# Patient Record
Sex: Female | Born: 1955 | Race: White | Hispanic: No | Marital: Married | Smoking: Never smoker
Health system: Southern US, Community
[De-identification: ages and names within clinical notes are randomized; demographics above are authoritative.]

## PROBLEM LIST (undated history)

## (undated) DIAGNOSIS — C801 Malignant (primary) neoplasm, unspecified: Secondary | ICD-10-CM

## (undated) HISTORY — PX: CHOLECYSTECTOMY: SHX55

## (undated) HISTORY — PX: EXPLORATORY LAPAROTOMY: SUR591

## (undated) HISTORY — PX: BREAST LUMPECTOMY: SHX2

## (undated) HISTORY — PX: ENDOMETRIAL ABLATION: SHX621

## (undated) HISTORY — PX: ABDOMINAL HYSTERECTOMY: SHX81

## (undated) HISTORY — PX: DERMOID CYST  EXCISION: SHX1452

## (undated) HISTORY — PX: BREAST RECONSTRUCTION: SHX9

---

## 1999-03-23 ENCOUNTER — Other Ambulatory Visit: Admission: RE | Admit: 1999-03-23 | Discharge: 1999-03-23 | Payer: Self-pay | Admitting: Obstetrics and Gynecology

## 2001-01-25 ENCOUNTER — Other Ambulatory Visit: Admission: RE | Admit: 2001-01-25 | Discharge: 2001-01-25 | Payer: Self-pay | Admitting: *Deleted

## 2001-03-29 ENCOUNTER — Ambulatory Visit (HOSPITAL_COMMUNITY): Admission: RE | Admit: 2001-03-29 | Discharge: 2001-03-29 | Payer: Self-pay | Admitting: *Deleted

## 2002-08-06 ENCOUNTER — Other Ambulatory Visit: Admission: RE | Admit: 2002-08-06 | Discharge: 2002-08-06 | Payer: Self-pay | Admitting: Obstetrics and Gynecology

## 2005-04-26 ENCOUNTER — Other Ambulatory Visit: Admission: RE | Admit: 2005-04-26 | Discharge: 2005-04-26 | Payer: Self-pay | Admitting: Obstetrics and Gynecology

## 2007-07-01 LAB — HM PAP SMEAR: HM Pap smear: NORMAL

## 2007-07-31 ENCOUNTER — Ambulatory Visit: Payer: Self-pay | Admitting: Family Medicine

## 2008-06-02 LAB — LIPID PANEL
Cholesterol: 247 mg/dL — AB (ref 0–200)
HDL: 46 mg/dL (ref 35–70)
LDL Cholesterol: 160 mg/dL
LDl/HDL Ratio: 3.5
Triglycerides: 203 mg/dL — AB (ref 40–160)

## 2008-06-02 LAB — TSH: TSH: 1.54 u[IU]/mL (ref <=5.90)

## 2008-06-15 ENCOUNTER — Ambulatory Visit: Payer: Self-pay | Admitting: Family Medicine

## 2009-01-13 ENCOUNTER — Ambulatory Visit: Payer: Self-pay | Admitting: Family Medicine

## 2009-02-02 ENCOUNTER — Ambulatory Visit: Payer: Self-pay | Admitting: Surgery

## 2011-12-20 DIAGNOSIS — C7951 Secondary malignant neoplasm of bone: Secondary | ICD-10-CM | POA: Insufficient documentation

## 2012-02-06 DIAGNOSIS — C50919 Malignant neoplasm of unspecified site of unspecified female breast: Secondary | ICD-10-CM | POA: Insufficient documentation

## 2012-07-24 DIAGNOSIS — M4850XA Collapsed vertebra, not elsewhere classified, site unspecified, initial encounter for fracture: Secondary | ICD-10-CM | POA: Insufficient documentation

## 2012-07-24 DIAGNOSIS — G588 Other specified mononeuropathies: Secondary | ICD-10-CM | POA: Insufficient documentation

## 2012-07-24 DIAGNOSIS — G894 Chronic pain syndrome: Secondary | ICD-10-CM | POA: Insufficient documentation

## 2012-07-24 DIAGNOSIS — F1120 Opioid dependence, uncomplicated: Secondary | ICD-10-CM | POA: Insufficient documentation

## 2012-10-09 ENCOUNTER — Ambulatory Visit: Payer: Self-pay | Admitting: Family Medicine

## 2012-11-27 ENCOUNTER — Ambulatory Visit: Payer: Self-pay | Admitting: Family Medicine

## 2013-05-01 ENCOUNTER — Ambulatory Visit: Payer: Self-pay | Admitting: Oncology

## 2013-05-05 ENCOUNTER — Ambulatory Visit: Payer: Self-pay | Admitting: Oncology

## 2013-05-08 ENCOUNTER — Ambulatory Visit: Payer: Self-pay | Admitting: Oncology

## 2013-05-10 ENCOUNTER — Ambulatory Visit: Payer: Self-pay | Admitting: Oncology

## 2013-05-20 DIAGNOSIS — C50919 Malignant neoplasm of unspecified site of unspecified female breast: Secondary | ICD-10-CM | POA: Insufficient documentation

## 2013-06-09 ENCOUNTER — Ambulatory Visit: Payer: Self-pay | Admitting: Oncology

## 2013-08-15 LAB — HEPATIC FUNCTION PANEL
ALT: 26 U/L (ref 7–35)
AST: 47 U/L — AB (ref 13–35)
Alkaline Phosphatase: 128 U/L — AB (ref 25–125)
Bilirubin, Total: 0.2 mg/dL

## 2013-08-15 LAB — BASIC METABOLIC PANEL
BUN: 8 mg/dL (ref 4–21)
Creatinine: 0.6 mg/dL (ref <=1.1)
Glucose: 88 mg/dL
Potassium: 4.6 mmol/L (ref 3.4–5.3)
Sodium: 144 mmol/L (ref 137–147)

## 2013-08-20 LAB — CBC AND DIFFERENTIAL
HCT: 37 % (ref 36–46)
Hemoglobin: 12.3 g/dL (ref 12.0–16.0)
Neutrophils Absolute: 2 /uL
Platelets: 222 10*3/uL (ref 150–399)
WBC: 3.1 10^3/mL

## 2013-08-25 ENCOUNTER — Ambulatory Visit: Payer: Self-pay | Admitting: Family Medicine

## 2014-02-04 ENCOUNTER — Ambulatory Visit: Payer: Self-pay

## 2014-08-03 ENCOUNTER — Ambulatory Visit: Payer: Self-pay | Admitting: Hematology and Oncology

## 2014-10-30 NOTE — Consult Note (Signed)
Note Type Consult   HPI: Referred by Primary physician Dr. Rosanna Randy.   This 59 year old Female patient presents to the clinic for second opinion about treatment plan for  Stage IV carcinoma of breast.  Subjective: Chief Complaint/Diagnosis:   1.carcinoma of breast diagnosed in July of 2010 (right breast) ER positive PR negative HER-2 negative to  2+ by IHC not amplified by FISH. Patient underwent neoadjuvant Aromasin and treatment Oncotype DX was 38 patient received adjuvant chemotherapy from October 2 010 5th Cytoxan, Adriamycin, 5-FU for 3 cycles followed by Taxotere followed by Abraxane (patient finished neoadjuvant chemotherapy on January of 2011) As mentioned above right breast lumpectomy.  Followed by modified radical mastectomy. yp T3 yp  not examined 1 mic. MO March of 2011 Had radiation therapy to the chest wall(June, 2011)was started on remained ask and then switched to Femara.  Was discontinued on May of 2013 because of metastatic disease.  The bone. Patient had a pain in the right arm and CT scan sows bone metastatic disease needle biopsy was consistent with metastatic disease.  Patient was then tried Aromasin, feeding ago, palliative radiation therapy to the spine.in July of 2014   switched to   progressing tumor marker and progressing disease.tumor markers on October 2 was 437.4 (CA  15-3)in July   2014  was 240.7MRI scan of spine on October 11 sows multiple new lesions. HPI:   59 year old lady with history of carcinoma of breastwith metastatic disease to bone here for further evaluation and treatment consideration.  Patient is getting most of his care at [facility].condition has been declining having increasing pain.  Rising tumor markers.  Recent MRI scan shows multiple new lesions.is under better control after increasing fentanyl patch to 37 mcg.  With the breakthrough pain medication.has been poor.presently getting Fasladex . Patient desired another opinion number does not  want to establish care locally   Review of Systems:  General: weakness  fatigue  Performance Status (ECOG): 1  HEENT: no complaints  Lungs: no complaints  Cardiac: no complaints  GI: no complaints  GU: no complaints  Musculoskeletal: back pain  muscle ache  Extremities: no complaints  Skin: no complaints  Neuro: no complaints  Endocrine: no complaints  Pain ?: Yes  Pain- Qualitative: Moderate  Pain- Plan: Narcotic analgesic ordered  Pain Effectiveness: partial pain relief  Review of Systems: All other systems were reviewed and found to be negative   Allergies:  Docetaxel: Hives  Prochlorperazine: Unknown  Significant History/PMH:   intercostal neuralgia:    vertebral compression fracture:    goiter:    IBS:    anxiety:    Arthritis:    Psoriasis:    GERD:    Right breast cancer with reconstruction:    bone mets:   Smoking History: Smoking History Never Smoked.  PFSH: Family History: noncontributory  Social History: negative alcohol, negative tobacco  Additional Past Medical and Surgical History: As mentioned above   Home Medications: Medication Instructions Last Modified Date/Time  fentaNYL 25 mcg/hr transdermal film, extended release 1 patch transdermal every 72 hours 27-Oct-14 14:16  fentaNYL 12 mcg/hr transdermal film, extended release 1 patch transdermal every 72 hours 27-Oct-14 14:16  Vitamin B12 1000 mcg oral tablet 1 tab(s) orally once a day 27-Oct-14 14:16  oxaprozin 600 mg oral tablet 1 tab(s) orally 2 times a day 27-Oct-14 14:16  gabapentin 100 mg oral capsule 3 cap(s) orally once a day in the morning, 7 caps orally at bedtime 27-Oct-14 14:16  Lidoderm 5% topical film Apply topically to affected area once a day, As Needed 27-Oct-14 14:16  LORazepam 1 mg oral tablet 1 tab(s) orally 2 times a day, 1/2 tab in the morning and 1 tab at bedtime 27-Oct-14 14:16  SUMAtriptan 100 mg oral tablet 1 tab(s) orally once, As Needed 27-Oct-14 14:16   chondroitin-glucosamine 400 mg-500 mg oral capsule 1 tab(s) orally once a day 27-Oct-14 14:16  calcium phosphate-vitamin D3 200mg  calcium-200 unit Chew 1 tab(s) orally once a day 27-Oct-14 14:16  pyridoxine 100 mg oral tablet 1 tab(s) orally once a day 27-Oct-14 14:16  ZOLMitriptan 2.5 mg oral tablet 1 tab(s) orally once, As Needed for headache, if headache returns, may take second dose after 2 hours 27-Oct-14 14:16  omeprazole 20 mg oral delayed release capsule 1 cap(s) orally 2 times a day 27-Oct-14 14:16  citalopram 20 mg oral tablet 1 tab(s) orally once a day 27-Oct-14 14:16  docusate sodium 100 mg oral capsule 3 cap(s) orally 2 times a day 27-Oct-14 14:16   Vital Signs:  :: Ht(CM): 160 Wt(KG): 62.2 BSA: 1.6 Temp: 96.3 Pulse: 65 RR: 18  BP: 134/53   Physical Exam:  General: Patient is alert oriented not in any acute distress  Head, Ears, Nose,Throat: normal, no lesions or deformities  Neck, Thyroid: no thyroid tenderness, enlargement or nodule.  neck supple without massess or tenderness. no adenopathy.  no carotid bruit.  Respiratory: Examination of chest: Air entry equal on both sides.  Emphysematous chest   Rhonchi: Not present   No wheezing    no rales  Cardiovascular: No murmur.  Heart sounds are within normal limit   No evidence of pericardial rub  Breast: Right breast patient had a reconstructive surgery.  LEFT  breast field masses  Gastrointestinal: Abdomen: Soft   Liver not palpable   Spleen not palpable   No tenderness   No ascites   Bowel sounds are present  Musculoskeletal: No deformity..   No evidence of fracture..   No joint swelling..   Lower extremity no edema  Skin: no rashes, ulcers, or lesions  Neurological: Higher functions have been normal limit.      Cranial nerves are intact   Motor system no evidence of localizing weakness   Sensory system no evidence of localizing weakness   No evidence of peripheral neuropathy  Lymphatics: no cervical, axillary, or  inguinal lymphadenopathy   Lab Results Review:  Lab Results   all tumor markers from [facility] has been reviewed  Assessment and Plan: Impression:   1.carcinoma of breast metastases to the bone on clinical grounds based on MRI scan and tumor markers patient is progressing on present treatment with FASLADEX .is on XGEVA All the records from the [facility] has been reviewed. all records patient either did not tolerate or has failed multiple hormone manipulation.went over all the information with patientwould recommend PET scan to be sure there is no organ involvement with metastatic diseasealso discussed possibility of chemotherapy options rather than going to any phase I triallike eribulin Abraxane has proven valuealso discussed foundation one approach and let cough clinical validation with such approaches. am not sure actually what patient's desire is at present timeappears that patient's pain is under better control after increasing fentanyl patch. patient wants and then after PET scan for restaging we can meet again and discussed  availability of our clinical trialsthen patient can decide whether to continue her treatment program  CC Referral:  cc: Dr Rosanna Randy   Electronic  Signatures: Jobe Gibbon (MD)  (Signed 28-Oct-14 13:10)  Authored: Note Type, History of Present Illness, CC/HPI, Review of Systems, ALLERGIES, PAST MEDICAL HISTORY, Smoking Cessation, Patient Family Social History, HOME MEDICATIONS, Vital Signs, Physical Exam, Lab Results Review, Assessment and Plan, CC Referring Physician   Last Updated: 28-Oct-14 13:10 by Jobe Gibbon (MD)

## 2015-01-25 DIAGNOSIS — Z9221 Personal history of antineoplastic chemotherapy: Secondary | ICD-10-CM | POA: Insufficient documentation

## 2015-01-25 DIAGNOSIS — G4700 Insomnia, unspecified: Secondary | ICD-10-CM | POA: Insufficient documentation

## 2015-01-25 DIAGNOSIS — G8929 Other chronic pain: Secondary | ICD-10-CM | POA: Insufficient documentation

## 2015-01-25 DIAGNOSIS — M549 Dorsalgia, unspecified: Secondary | ICD-10-CM

## 2015-01-25 DIAGNOSIS — F32A Depression, unspecified: Secondary | ICD-10-CM | POA: Insufficient documentation

## 2015-01-25 DIAGNOSIS — C50919 Malignant neoplasm of unspecified site of unspecified female breast: Secondary | ICD-10-CM | POA: Insufficient documentation

## 2015-01-25 DIAGNOSIS — B009 Herpesviral infection, unspecified: Secondary | ICD-10-CM | POA: Insufficient documentation

## 2015-01-25 DIAGNOSIS — D72819 Decreased white blood cell count, unspecified: Secondary | ICD-10-CM | POA: Insufficient documentation

## 2015-01-25 DIAGNOSIS — G43909 Migraine, unspecified, not intractable, without status migrainosus: Secondary | ICD-10-CM | POA: Insufficient documentation

## 2015-01-25 DIAGNOSIS — K589 Irritable bowel syndrome without diarrhea: Secondary | ICD-10-CM | POA: Insufficient documentation

## 2015-01-25 DIAGNOSIS — L409 Psoriasis, unspecified: Secondary | ICD-10-CM | POA: Insufficient documentation

## 2015-01-25 DIAGNOSIS — O99810 Abnormal glucose complicating pregnancy: Secondary | ICD-10-CM | POA: Insufficient documentation

## 2015-01-25 DIAGNOSIS — M129 Arthropathy, unspecified: Secondary | ICD-10-CM | POA: Insufficient documentation

## 2015-01-25 DIAGNOSIS — IMO0002 Reserved for concepts with insufficient information to code with codable children: Secondary | ICD-10-CM | POA: Insufficient documentation

## 2015-01-25 DIAGNOSIS — K219 Gastro-esophageal reflux disease without esophagitis: Secondary | ICD-10-CM | POA: Insufficient documentation

## 2015-01-25 DIAGNOSIS — F411 Generalized anxiety disorder: Secondary | ICD-10-CM | POA: Insufficient documentation

## 2015-01-25 DIAGNOSIS — F329 Major depressive disorder, single episode, unspecified: Secondary | ICD-10-CM | POA: Insufficient documentation

## 2015-01-25 DIAGNOSIS — E041 Nontoxic single thyroid nodule: Secondary | ICD-10-CM | POA: Insufficient documentation

## 2015-01-26 ENCOUNTER — Ambulatory Visit: Payer: Self-pay | Admitting: Family Medicine

## 2015-01-28 ENCOUNTER — Encounter: Payer: Self-pay | Admitting: Family Medicine

## 2015-01-28 ENCOUNTER — Ambulatory Visit (INDEPENDENT_AMBULATORY_CARE_PROVIDER_SITE_OTHER): Payer: 59 | Admitting: Family Medicine

## 2015-01-28 VITALS — BP 108/60 | HR 88 | Temp 97.9°F | Resp 16 | Wt 135.0 lb

## 2015-01-28 DIAGNOSIS — F411 Generalized anxiety disorder: Secondary | ICD-10-CM

## 2015-01-28 DIAGNOSIS — K219 Gastro-esophageal reflux disease without esophagitis: Secondary | ICD-10-CM | POA: Insufficient documentation

## 2015-01-28 DIAGNOSIS — L4050 Arthropathic psoriasis, unspecified: Secondary | ICD-10-CM | POA: Insufficient documentation

## 2015-01-28 MED ORDER — LORAZEPAM 0.5 MG PO TABS: 0.5000 mg | ORAL_TABLET | Freq: Three times a day (TID) | ORAL | Status: DC

## 2015-01-28 NOTE — Progress Notes (Signed)
Patient ID: Cristina David, female   DOB: Aug 15, 1955, 59 y.o.   MRN: [id number]    Subjective:  HPI Pt reports that she is here to get her Lorazepam rx reduced. She would like to get the 0.5 mg instead of the 1mg . She is doing better and is on so much medication that makes her sleepy, she does not need as much of this to make her more sleepy. She is requesting the 0.5mg  and possibly breaking those in half when she needs to.   Prior to Admission medications   Medication Sig Start Date End Date Taking? Authorizing Provider  Capecitabine (XELODA PO) Take 3,000 mg by mouth daily.   Yes Historical Provider, MD  citalopram (CELEXA) 20 MG tablet Take by mouth. 03/26/14  Yes Historical Provider, MD  cyanocobalamin 100 MCG tablet Take by mouth. 11/27/12  Yes Historical Provider, MD  denosumab (XGEVA) 120 MG/1.7ML SOLN injection Inject 120 mg into the skin once.   Yes Historical Provider, MD  fentaNYL (DURAGESIC) 25 MCG/HR patch Place onto the skin. 03/01/12  Yes Historical Provider, MD  gabapentin (NEURONTIN) 300 MG capsule Take by mouth. 05/15/14  Yes Historical Provider, MD  Glucosamine HCl 500 MG TABS Take by mouth. 11/27/12  Yes Historical Provider, MD  NUTRITIONAL SUPPLEMENTS PO Take by mouth. 11/27/12  Yes Historical Provider, MD  Omeprazole 20 MG TBEC Take by mouth. 06/04/13  Yes Historical Provider, MD  Pyridoxine HCl (VITAMIN B6) 250 MG TABS Take by mouth. 11/27/12  Yes Historical Provider, MD  docusate sodium (STOOL SOFTENER) 100 MG capsule Take 100 mg by mouth.    Historical Provider, MD  LORazepam (ATIVAN) 1 MG tablet Take by mouth. 06/05/14   Historical Provider, MD  oxaprozin (DAYPRO) 600 MG tablet Take by mouth. 11/27/12   Historical Provider, MD  oxycodone (OXY-IR) 5 MG capsule Take 10 mg by mouth.    Historical Provider, MD    Patient Active Problem List   Diagnosis Date Noted  . Acid reflux 01/28/2015  . 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 Arthropathia 01/25/2015  . Back pain, chronic  01/25/2015  . Malignant neoplasm of breast (female) 01/25/2015  . Anxiety, generalized 01/25/2015  . Gastro-esophageal reflux disease without esophagitis 01/25/2015  . Abnormal maternal glucose tolerance, complicating pregnancy 51/88/4166  . Personal history of antineoplastic chemotherapy 01/25/2015  . Herpes simplex 01/25/2015  . History of endocrine, metabolic or immunity disorder 01/25/2015  . Adaptive colitis 01/25/2015  . Cannot sleep 01/25/2015  . Decreased leukocytes 01/25/2015  . Headache, migraine 01/25/2015  . Clinical depression 01/25/2015  . Thyroid nodule 01/25/2015  . Psoriasis 01/25/2015  . Breast CA 05/20/2013  . Chronic pain associated with significant psychosocial dysfunction 07/24/2012  . 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 Continuous opioid dependence 07/24/2012  . Primary breast cancer 02/06/2012  . Bone metastases 12/20/2011    History reviewed. No pertinent past medical history.  History   Social History  . Marital Status: Married    Spouse Name: N/A  . Number of Children: N/A  . Years of Education: N/A   Occupational History  . Not on file.   Social History Main Topics  . Smoking status: Never Smoker   . Smokeless tobacco: Not on file  . Alcohol Use: Yes     Comment: 1 glass of wine amonth  . Drug Use: No  . Sexual Activity: Not on file   Other Topics Concern  . Not on file   Social History Narrative    Allergies  Allergen Reactions  .  Docetaxel Hives and Rash  . Prochlorperazine Other (See Comments)    MUSCLE TWITCHING    Review of Systems  Constitutional: Negative.   HENT: Negative.   Eyes: Negative.   Respiratory: Negative.   Cardiovascular: Negative.   Gastrointestinal: Negative.   Genitourinary: Negative.   Musculoskeletal: Negative.   Skin: Negative.   Neurological: Negative.   Endo/Heme/Allergies: Negative.   Psychiatric/Behavioral: The patient is nervous/anxious.     Immunization History  Administered Date(s)  Administered  . Td 12/02/2004  . Tdap 05/11/2011   Objective:  BP 108/60 mmHg  Pulse 88  Temp(Src) 97.9 F (36.6 C) (Oral)  Resp 16  Wt 135 lb (61.236 kg)  Physical Exam  Constitutional: She is well-developed, well-nourished, and in no distress.  HENT:  Head: Normocephalic and atraumatic.  Right Ear: External ear normal.  Left Ear: External ear normal.  Nose: Nose normal.  Eyes: Conjunctivae are normal.  Neck: Normal range of motion. Neck supple.  Cardiovascular: Normal rate, regular rhythm and normal heart sounds.   Pulmonary/Chest: Effort normal and breath sounds normal.  Abdominal: Soft. Bowel sounds are normal.  Musculoskeletal: She exhibits edema.  Where sleeve on her right arm for a chronic lymphedema from mastectomy and axillary dissection. She has breast cancer metastatic to bone and there spots in her lower cervical and upper thoracic region that are tender to the touch due to metastatic disease and she is tender on the right posterior thorax just below the scapula.    Lab Results  Component Value Date   WBC 3.1 08/20/2013   HGB 12.3 08/20/2013   HCT 37 08/20/2013   PLT 222 08/20/2013   CHOL 247* 06/02/2008   TRIG 203* 06/02/2008   HDL 46 06/02/2008   LDLCALC 160 06/02/2008   TSH 1.54 06/02/2008    CMP     Component Value Date/Time   NA 144 08/15/2013   K 4.6 08/15/2013   BUN 8 08/15/2013   CREATININE 0.6 08/15/2013   AST 47* 08/15/2013   ALT 26 08/15/2013   ALKPHOS 128* 08/15/2013    Assessment and Plan :  Breast cancer metastatic to bone She is on Duragesic patch for the pain but has cut down to 37 on recently. She is on chemotherapy for this and has an oncologist in [place].  Chronic anxiety Cut back on her lorazepam to 0.5 mg twice a day when necessary. I am happy to cut back for bimalleolar concerned about this as she has probably been on this for more than 20 years. Follow clinically. Again, this is a chronic issue and we'll be happy to go  back up on the dose if needed. Chronic depression Stable on meds. Or than 50% time spent talking with patient cancer anxiety and depression.  I have done the exam and reviewed the above chart and it is accurate to the best of my knowledge.  Miguel Aschoff MD [facility] 01/28/2015 10:14 AM

## 2015-05-23 ENCOUNTER — Other Ambulatory Visit: Payer: Self-pay | Admitting: Family Medicine

## 2015-06-10 ENCOUNTER — Other Ambulatory Visit: Payer: Self-pay | Admitting: Family Medicine

## 2015-06-11 ENCOUNTER — Other Ambulatory Visit: Payer: Self-pay | Admitting: Family Medicine

## 2015-08-04 ENCOUNTER — Other Ambulatory Visit: Payer: Self-pay

## 2015-08-04 DIAGNOSIS — M545 Low back pain: Secondary | ICD-10-CM

## 2015-08-04 MED ORDER — CYCLOBENZAPRINE HCL 10 MG PO TABS: 10.0000 mg | ORAL_TABLET | Freq: Three times a day (TID) | ORAL | Status: AC | PRN

## 2015-09-02 ENCOUNTER — Other Ambulatory Visit: Payer: Self-pay | Admitting: Hematology and Oncology

## 2015-09-02 DIAGNOSIS — C50919 Malignant neoplasm of unspecified site of unspecified female breast: Secondary | ICD-10-CM

## 2015-09-08 ENCOUNTER — Telehealth: Payer: Self-pay

## 2015-09-08 ENCOUNTER — Other Ambulatory Visit: Payer: Self-pay

## 2015-09-08 MED ORDER — MECLIZINE HCL 25 MG PO TABS: 25.0000 mg | ORAL_TABLET | Freq: Three times a day (TID) | ORAL | Status: DC | PRN

## 2015-09-08 MED ORDER — MECLIZINE HCL 32 MG PO TABS: 32.0000 mg | ORAL_TABLET | Freq: Three times a day (TID) | ORAL | Status: DC | PRN

## 2015-09-08 NOTE — Telephone Encounter (Signed)
Pharmacist called and states that meclizine is not made in 32 mg dose so it was switched to 25 mg. And dr. Lonia David

## 2015-09-14 ENCOUNTER — Ambulatory Visit
Admission: RE | Admit: 2015-09-14 | Discharge: 2015-09-14 | Disposition: A | Discharge status: Home / Self Care | Payer: 59 | Source: Ambulatory Visit | Attending: Hematology and Oncology | Admitting: Hematology and Oncology

## 2015-09-14 ENCOUNTER — Ambulatory Visit: Admission: RE | Admit: 2015-09-14 | Payer: 59 | Source: Ambulatory Visit

## 2015-09-14 ENCOUNTER — Encounter
Admission: RE | Admit: 2015-09-14 | Discharge: 2015-09-14 | Disposition: A | Discharge status: Home / Self Care | Payer: 59 | Source: Ambulatory Visit | Attending: Hematology and Oncology | Admitting: Hematology and Oncology

## 2015-09-14 DIAGNOSIS — C50919 Malignant neoplasm of unspecified site of unspecified female breast: Secondary | ICD-10-CM | POA: Diagnosis not present

## 2015-09-14 DIAGNOSIS — R590 Localized enlarged lymph nodes: Secondary | ICD-10-CM | POA: Diagnosis not present

## 2015-09-14 DIAGNOSIS — Z9221 Personal history of antineoplastic chemotherapy: Secondary | ICD-10-CM | POA: Diagnosis not present

## 2015-09-14 DIAGNOSIS — K760 Fatty (change of) liver, not elsewhere classified: Secondary | ICD-10-CM | POA: Diagnosis not present

## 2015-09-14 DIAGNOSIS — C7951 Secondary malignant neoplasm of bone: Secondary | ICD-10-CM | POA: Diagnosis not present

## 2015-09-14 DIAGNOSIS — I7090 Unspecified atherosclerosis: Secondary | ICD-10-CM | POA: Diagnosis not present

## 2015-09-14 DIAGNOSIS — R978 Other abnormal tumor markers: Secondary | ICD-10-CM | POA: Diagnosis present

## 2015-09-14 DIAGNOSIS — C799 Secondary malignant neoplasm of unspecified site: Secondary | ICD-10-CM | POA: Diagnosis present

## 2015-09-14 HISTORY — DX: Malignant (primary) neoplasm, unspecified: C80.1

## 2015-09-14 MED ORDER — TECHNETIUM TC 99M MEDRONATE IV KIT
25.0000 | PACK | Freq: Once | INTRAVENOUS | Status: AC | PRN
  Administered 2015-09-14: 21.91 via INTRAVENOUS

## 2015-09-14 MED ORDER — IOHEXOL 300 MG/ML  SOLN
100.0000 mL | Freq: Once | INTRAMUSCULAR | Status: AC | PRN
  Administered 2015-09-14: 100 mL via INTRAVENOUS

## 2015-09-20 ENCOUNTER — Other Ambulatory Visit: Payer: Self-pay | Admitting: Family Medicine

## 2016-01-26 ENCOUNTER — Other Ambulatory Visit
Admission: RE | Admit: 2016-01-26 | Discharge: 2016-01-26 | Disposition: A | Discharge status: Home / Self Care | Payer: 59 | Source: Ambulatory Visit | Attending: Hematology and Oncology | Admitting: Hematology and Oncology

## 2016-01-26 DIAGNOSIS — C7951 Secondary malignant neoplasm of bone: Secondary | ICD-10-CM | POA: Insufficient documentation

## 2016-01-26 DIAGNOSIS — C50211 Malignant neoplasm of upper-inner quadrant of right female breast: Secondary | ICD-10-CM | POA: Diagnosis present

## 2016-01-26 LAB — CBC WITH DIFFERENTIAL/PLATELET
Basophils Absolute: 0 10*3/uL (ref 0–0.1)
Basophils Relative: 1 %
Eosinophils Absolute: 0 10*3/uL (ref 0–0.7)
Eosinophils Relative: 1 %
HCT: 32.2 % — ABNORMAL LOW (ref 35.0–47.0)
Hemoglobin: 11.1 g/dL — ABNORMAL LOW (ref 12.0–16.0)
Lymphocytes Relative: 19 %
Lymphs Abs: 0.5 10*3/uL — ABNORMAL LOW (ref 1.0–3.6)
MCH: 34.1 pg — ABNORMAL HIGH (ref 26.0–34.0)
MCHC: 34.4 g/dL (ref 32.0–36.0)
MCV: 99.1 fL (ref 80.0–100.0)
Monocytes Absolute: 0.4 10*3/uL (ref 0.2–0.9)
Monocytes Relative: 13 %
Neutro Abs: 1.9 10*3/uL (ref 1.4–6.5)
Neutrophils Relative %: 66 %
Platelets: 231 10*3/uL (ref 150–440)
RBC: 3.25 MIL/uL — ABNORMAL LOW (ref 3.80–5.20)
RDW: 16.4 % — ABNORMAL HIGH (ref 11.5–14.5)
WBC: 2.9 10*3/uL — ABNORMAL LOW (ref 3.6–11.0)

## 2016-01-26 LAB — COMPREHENSIVE METABOLIC PANEL
ALT: 18 U/L (ref 14–54)
AST: 34 U/L (ref 15–41)
Albumin: 3.7 g/dL (ref 3.5–5.0)
Alkaline Phosphatase: 51 U/L (ref 38–126)
Anion gap: 7 (ref 5–15)
BUN: 15 mg/dL (ref 6–20)
CO2: 29 mmol/L (ref 22–32)
Calcium: 9.3 mg/dL (ref 8.9–10.3)
Chloride: 98 mmol/L — ABNORMAL LOW (ref 101–111)
Creatinine, Ser: 0.77 mg/dL (ref 0.44–1.00)
GFR calc Af Amer: >60 mL/min (ref >60)
GFR calc non Af Amer: >60 mL/min (ref >60)
Glucose, Bld: 105 mg/dL — ABNORMAL HIGH (ref 65–99)
Potassium: 4 mmol/L (ref 3.5–5.1)
Sodium: 134 mmol/L — ABNORMAL LOW (ref 135–145)
Total Bilirubin: 0.6 mg/dL (ref 0.3–1.2)
Total Protein: 6.7 g/dL (ref 6.5–8.1)

## 2016-01-27 LAB — MISC LABCORP TEST (SEND OUT)
LabCorp test name: 3
Labcorp test code: 143404

## 2016-04-06 ENCOUNTER — Other Ambulatory Visit: Payer: Self-pay | Admitting: Emergency Medicine

## 2016-04-06 MED ORDER — LORAZEPAM 0.5 MG PO TABS: 0.5000 mg | ORAL_TABLET | Freq: Three times a day (TID) | ORAL | 5 refills | Status: DC | PRN

## 2016-04-06 NOTE — Progress Notes (Signed)
Refilled per Dr. Rosanna Randy verbal order.

## 2016-05-16 ENCOUNTER — Other Ambulatory Visit: Payer: Self-pay | Admitting: Emergency Medicine

## 2016-05-16 MED ORDER — GABAPENTIN 100 MG PO CAPS: 100.0000 mg | ORAL_CAPSULE | Freq: Three times a day (TID) | ORAL | 12 refills | Status: DC

## 2016-05-30 ENCOUNTER — Other Ambulatory Visit: Payer: Self-pay

## 2016-05-30 MED ORDER — SUMATRIPTAN SUCCINATE 100 MG PO TABS: ORAL_TABLET | ORAL | 12 refills | Status: AC

## 2016-06-14 ENCOUNTER — Other Ambulatory Visit: Payer: Self-pay | Admitting: Family Medicine

## 2016-07-24 LAB — CBC AND DIFFERENTIAL
HCT: 26 % — AB (ref 36–46)
Hemoglobin: 8.5 g/dL — AB (ref 12.0–16.0)
Neutrophils Absolute: 2 /uL
Platelets: 262 10*3/uL (ref 150–399)
WBC: 2.8 10^3/mL

## 2016-07-24 LAB — BASIC METABOLIC PANEL
BUN: 13 mg/dL (ref 4–21)
Creatinine: 1 mg/dL (ref 0.5–1.1)
Glucose: 104 mg/dL
Potassium: 5 mmol/L (ref 3.4–5.3)
Sodium: 139 mmol/L (ref 137–147)

## 2016-07-24 LAB — HEPATIC FUNCTION PANEL
ALT: 26 U/L (ref 7–35)
AST: 69 U/L — AB (ref 13–35)
Alkaline Phosphatase: 115 U/L (ref 25–125)
Bilirubin, Total: 0.4 mg/dL

## 2016-07-28 ENCOUNTER — Ambulatory Visit
Admission: RE | Admit: 2016-07-28 | Discharge: 2016-07-28 | Disposition: A | Discharge status: Home / Self Care | Payer: BLUE CROSS/BLUE SHIELD | Source: Ambulatory Visit | Attending: Family Medicine | Admitting: Family Medicine

## 2016-07-28 ENCOUNTER — Other Ambulatory Visit: Payer: Self-pay | Admitting: Emergency Medicine

## 2016-07-28 DIAGNOSIS — M545 Low back pain: Secondary | ICD-10-CM | POA: Diagnosis not present

## 2016-07-28 DIAGNOSIS — N23 Unspecified renal colic: Secondary | ICD-10-CM

## 2016-07-28 DIAGNOSIS — R6884 Jaw pain: Secondary | ICD-10-CM | POA: Diagnosis not present

## 2016-07-28 DIAGNOSIS — C50919 Malignant neoplasm of unspecified site of unspecified female breast: Secondary | ICD-10-CM | POA: Diagnosis not present

## 2016-07-28 DIAGNOSIS — C7951 Secondary malignant neoplasm of bone: Secondary | ICD-10-CM | POA: Insufficient documentation

## 2016-07-31 ENCOUNTER — Ambulatory Visit (INDEPENDENT_AMBULATORY_CARE_PROVIDER_SITE_OTHER): Payer: BLUE CROSS/BLUE SHIELD | Admitting: Family Medicine

## 2016-07-31 ENCOUNTER — Encounter: Payer: Self-pay | Admitting: Family Medicine

## 2016-07-31 VITALS — BP 118/64 | HR 64 | Temp 98.6°F | Resp 14 | Wt 140.8 lb

## 2016-07-31 DIAGNOSIS — F411 Generalized anxiety disorder: Secondary | ICD-10-CM

## 2016-07-31 DIAGNOSIS — M545 Low back pain: Secondary | ICD-10-CM

## 2016-07-31 DIAGNOSIS — K219 Gastro-esophageal reflux disease without esophagitis: Secondary | ICD-10-CM | POA: Diagnosis not present

## 2016-07-31 DIAGNOSIS — Y92009 Unspecified place in unspecified non-institutional (private) residence as the place of occurrence of the external cause: Secondary | ICD-10-CM

## 2016-07-31 DIAGNOSIS — C7951 Secondary malignant neoplasm of bone: Secondary | ICD-10-CM | POA: Diagnosis not present

## 2016-07-31 DIAGNOSIS — C50919 Malignant neoplasm of unspecified site of unspecified female breast: Secondary | ICD-10-CM | POA: Diagnosis not present

## 2016-07-31 DIAGNOSIS — G43819 Other migraine, intractable, without status migrainosus: Secondary | ICD-10-CM

## 2016-07-31 DIAGNOSIS — Y92099 Unspecified place in other non-institutional residence as the place of occurrence of the external cause: Secondary | ICD-10-CM

## 2016-07-31 DIAGNOSIS — F3289 Other specified depressive episodes: Secondary | ICD-10-CM

## 2016-07-31 DIAGNOSIS — W19XXXA Unspecified fall, initial encounter: Secondary | ICD-10-CM

## 2016-07-31 DIAGNOSIS — G8929 Other chronic pain: Secondary | ICD-10-CM | POA: Diagnosis not present

## 2016-07-31 DIAGNOSIS — D6489 Other specified anemias: Secondary | ICD-10-CM

## 2016-07-31 DIAGNOSIS — D649 Anemia, unspecified: Secondary | ICD-10-CM | POA: Insufficient documentation

## 2016-07-31 DIAGNOSIS — Z8639 Personal history of other endocrine, nutritional and metabolic disease: Secondary | ICD-10-CM | POA: Insufficient documentation

## 2016-07-31 MED ORDER — FENTANYL 75 MCG/HR TD PT72: 75.0000 ug | MEDICATED_PATCH | TRANSDERMAL | 0 refills | Status: DC

## 2016-07-31 NOTE — Progress Notes (Signed)
Cristina David  MRN: [id number] DOB: September 28, 1955  Subjective:  HPI  Patient is here for follow up. She is here to discuss pain management. She has metastatic breast cancer. Medications updated in the chart today. She is seen hemotologist/oncologist regularly. Last record of CBC and metC was done per [facility] system in 07/24/16. She is taking Gabapentin, Oxycodone as needed when pain gets very severe, using Fentanyl patches every 3 days. Chronic pain due to metastatic cancer, pain is usually present in femur bilateral, hips, pelvis area, back and she does get headaches and takes Imitrex as needed. She does get some tremor in her hands at times when she is resting mainly she noticed. Patient states she fell today tripping over her shoe lase and hit her head. She feels ok right now. Patient Active Problem List   Diagnosis Date Noted  . Acid reflux 01/28/2015  . Arthropathic psoriasis ([place]) 01/28/2015  . Arthropathia 01/25/2015  . Back pain, chronic 01/25/2015  . Malignant neoplasm of breast (female) ([place]) 01/25/2015  . Anxiety, generalized 01/25/2015  . Gastro-esophageal reflux disease without esophagitis 01/25/2015  . Abnormal maternal glucose tolerance, complicating pregnancy [id number]  . Personal history of antineoplastic chemotherapy 01/25/2015  . Herpes simplex 01/25/2015  . History of endocrine, metabolic or immunity disorder 01/25/2015  . Adaptive colitis 01/25/2015  . Cannot sleep 01/25/2015  . Decreased leukocytes 01/25/2015  . Headache, migraine 01/25/2015  . Clinical depression 01/25/2015  . Thyroid nodule 01/25/2015  . Psoriasis 01/25/2015  . Breast CA ([place]) 05/20/2013  . Chronic pain associated with significant psychosocial dysfunction 07/24/2012  . 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 Continuous opioid dependence ([place]) 07/24/2012  . Primary breast cancer ([place]) 02/06/2012  . Bone metastases ([place]) 12/20/2011    Past Medical History:  Diagnosis Date  . Cancer [facility])      Social History   Social History  . Marital status: Married    Spouse name: N/A  . Number of children: N/A  . Years of education: N/A   Occupational History  . Not on file.   Social History Main Topics  . Smoking status: Never Smoker  . Smokeless tobacco: Never Used  . Alcohol use Yes     Comment: 1 glass of wine amonth  . Drug use: No  . Sexual activity: Not on file   Other Topics Concern  . Not on file   Social History Narrative  . No narrative on file    Outpatient Encounter Prescriptions as of 07/31/2016  Medication Sig Note  . citalopram (CELEXA) 10 MG tablet Take 10 mg by mouth daily.   . cyanocobalamin 100 MCG tablet Take by mouth. 01/25/2015: Received from: [facility]  . cyclobenzaprine (FLEXERIL) 10 MG tablet Take 1 tablet (10 mg total) by mouth 3 (three) times daily as needed for muscle spasms.   Marland Kitchen denosumab (XGEVA) 120 MG/1.7ML SOLN injection Inject 120 mg into the skin once.   . docusate sodium (STOOL SOFTENER) 100 MG capsule Take 100 mg by mouth. 01/28/2015: Received from: [facility]  . fentaNYL (DURAGESIC - DOSED MCG/HR) 50 MCG/HR Place 50 mcg onto the skin every 3 (three) days.   Marland Kitchen gabapentin (NEURONTIN) 100 MG capsule Take 100 mg by mouth every morning.   . gabapentin (NEURONTIN) 100 MG capsule Take 100 mg by mouth. Takes 2 tablets in the evening-=200 mg   . Glucosamine HCl 500 MG TABS Take by mouth. 01/25/2015: Received from: [facility]  . LORazepam (ATIVAN) 0.5 MG tablet Take  1 tablet (0.5 mg total) by mouth 3 (three) times daily as needed.   . meclizine (ANTIVERT) 25 MG tablet Take 1 tablet (25 mg total) by mouth 3 (three) times daily as needed for dizziness.   . NUTRITIONAL SUPPLEMENTS PO Take by mouth. 01/25/2015: Received from: [facility]  . omeprazole (PRILOSEC) 20 MG capsule TAKE ONE CAPSULE BY MOUTH TWICE A DAY   . Oxycodone HCl 10 MG TABS Take 10 mg by mouth every 8 (eight) hours as  needed.   . Pyridoxine HCl (VITAMIN B6) 250 MG TABS Take by mouth. 01/25/2015: Received from: [facility]  . SUMAtriptan (IMITREX) 100 MG tablet 1 tablet daily as needed May repeat in 2 hours if headache persists or recurs.   . [DISCONTINUED] Capecitabine (XELODA PO) Take 3,000 mg by mouth daily.   . [DISCONTINUED] citalopram (CELEXA) 20 MG tablet TAKE 1 TABLET EVERY DAY   . [DISCONTINUED] fentaNYL (DURAGESIC) 25 MCG/HR patch Place onto the skin. 01/25/2015: titrating for worsening pain for bone mets. Received from: [facility]  . [DISCONTINUED] gabapentin (NEURONTIN) 100 MG capsule Take 1 capsule (100 mg total) by mouth 3 (three) times daily.   . [DISCONTINUED] Omeprazole 20 MG TBEC Take by mouth. 01/25/2015: Received from: [facility]  . [DISCONTINUED] oxaprozin (DAYPRO) 600 MG tablet Take by mouth. 01/25/2015: Received from: [facility]  . [DISCONTINUED] oxycodone (OXY-IR) 5 MG capsule Take 10 mg by mouth. 01/28/2015: Received from: [facility]   No facility-administered encounter medications on file as of 07/31/2016.     Allergies  Allergen Reactions  . Docetaxel Hives and Rash  . Prochlorperazine Other (See Comments)    MUSCLE TWITCHING    Review of Systems  Constitutional: Positive for malaise/fatigue.  Eyes: Negative.   Respiratory: Negative.   Cardiovascular: Negative.   Gastrointestinal: Negative.        Symptoms stable on medication  Musculoskeletal: Positive for back pain, falls (this morning 07/31/16, accidental due to stepping on shoe lace.), joint pain and myalgias.       Femurs, hips, pelvis, back pain from cancer.  Neurological: Positive for tingling (neuropathy symptoms per patient), tremors (at times when she is resting), weakness and headaches (at times). Negative for speech change and focal weakness.  Psychiatric/Behavioral: Positive for depression. The patient is nervous/anxious.        Symptoms  are stable on medication at this time    Objective:  BP 118/64   Pulse 64   Temp 98.6 F (37 C)   Resp 14   Wt 140 lb 12.8 oz (63.9 kg)   BMI 24.94 kg/m   Physical Exam  Constitutional: She is oriented to person, place, and time and well-developed, well-nourished, and in no distress. Vital signs are normal.  HENT:  Head: Normocephalic and atraumatic.  Left Ear: External ear normal.  Nose: Nose normal.  Eyes: Conjunctivae are normal. Pupils are equal, round, and reactive to light.  Neck: Normal range of motion. Neck supple. No thyromegaly present.  Cardiovascular: Normal rate, regular rhythm, normal heart sounds and intact distal pulses.   No murmur heard. Pulmonary/Chest: Effort normal and breath sounds normal. No respiratory distress. She has no wheezes.  Abdominal: Soft.  Lymphadenopathy:    She has no cervical adenopathy.  Neurological: She is alert and oriented to person, place, and time.  Skin: Skin is warm and dry.  Hair thinning out on side of head from radiation therapy.  Psychiatric: Mood, memory, affect and judgment normal.  Assessment and Plan :  1. Primary malignant neoplasm of breast, unspecified laterality [facility]) Following oncologist. Discussed pain management. Advised patient to discuss tremor issues with oncologist. 2. Bone metastases ([place]) See above. Patient wants to try and not increase opioids use as the time goes by. Discussed this in details. Will provide RX for Fentanyl patches at increase dose of 75 mcg and will hold it at the pharmacy until patient decides if she wants to take this increased dose. Discussed when and how to use Oxycodone with patient. 3. Gastro-esophageal reflux disease without esophagitis Stable.  4. Other migraine without status migrainosus, intractable Stable.  5. Chronic bilateral low back pain without sciatica  6. Other depression Stable on current regimen at this time.  7. Anxiety, generalized Stable at this time.We  recently increased her frequency of Ativan and this has helped. Anxiety is been a problem for many years.  8. Anemia due to other cause, not classified Following hematologist. Last CBC 07/24/16 and record is provided today. Patient scheduled to have transfusion tomorrow 08/01/16. 9. Fall Today at home. Patient is not in distress from this at this time. No neurological issues at this time. Discussed symptoms to watch out for and when to seek treatment. Follow as needed. 10. Breast cancer now with liver metastasis HPI, Exam and A&P transcribed under direction and in the presence of Miguel Aschoff, MD. I have done the exam and reviewed the chart and it is accurate to the best of my knowledge. Development worker, community has been used and  any errors in dictation or transcription are unintentional. Miguel Aschoff M.D. [facility]

## 2016-08-03 ENCOUNTER — Emergency Department: Payer: BLUE CROSS/BLUE SHIELD

## 2016-08-03 ENCOUNTER — Encounter: Payer: Self-pay | Admitting: *Deleted

## 2016-08-03 ENCOUNTER — Inpatient Hospital Stay
Admission: EM | Admit: 2016-08-03 | Discharge: 2016-08-06 | DRG: 872 | Disposition: A | Discharge status: Home / Self Care | Payer: BLUE CROSS/BLUE SHIELD | Attending: Internal Medicine | Admitting: Internal Medicine

## 2016-08-03 DIAGNOSIS — A419 Sepsis, unspecified organism: Secondary | ICD-10-CM | POA: Diagnosis present

## 2016-08-03 DIAGNOSIS — Z90721 Acquired absence of ovaries, unilateral: Secondary | ICD-10-CM

## 2016-08-03 DIAGNOSIS — N390 Urinary tract infection, site not specified: Secondary | ICD-10-CM | POA: Diagnosis present

## 2016-08-03 DIAGNOSIS — C50911 Malignant neoplasm of unspecified site of right female breast: Secondary | ICD-10-CM | POA: Diagnosis present

## 2016-08-03 DIAGNOSIS — C787 Secondary malignant neoplasm of liver and intrahepatic bile duct: Secondary | ICD-10-CM | POA: Diagnosis present

## 2016-08-03 DIAGNOSIS — Z803 Family history of malignant neoplasm of breast: Secondary | ICD-10-CM

## 2016-08-03 DIAGNOSIS — R319 Hematuria, unspecified: Secondary | ICD-10-CM | POA: Diagnosis present

## 2016-08-03 DIAGNOSIS — Z79811 Long term (current) use of aromatase inhibitors: Secondary | ICD-10-CM

## 2016-08-03 DIAGNOSIS — Z888 Allergy status to other drugs, medicaments and biological substances status: Secondary | ICD-10-CM | POA: Diagnosis not present

## 2016-08-03 DIAGNOSIS — Z923 Personal history of irradiation: Secondary | ICD-10-CM

## 2016-08-03 DIAGNOSIS — C7951 Secondary malignant neoplasm of bone: Secondary | ICD-10-CM | POA: Diagnosis present

## 2016-08-03 DIAGNOSIS — Z170 Estrogen receptor positive status [ER+]: Secondary | ICD-10-CM | POA: Diagnosis not present

## 2016-08-03 DIAGNOSIS — Z79899 Other long term (current) drug therapy: Secondary | ICD-10-CM

## 2016-08-03 DIAGNOSIS — F329 Major depressive disorder, single episode, unspecified: Secondary | ICD-10-CM | POA: Diagnosis present

## 2016-08-03 DIAGNOSIS — N179 Acute kidney failure, unspecified: Secondary | ICD-10-CM | POA: Diagnosis present

## 2016-08-03 DIAGNOSIS — T451X5A Adverse effect of antineoplastic and immunosuppressive drugs, initial encounter: Secondary | ICD-10-CM | POA: Diagnosis present

## 2016-08-03 DIAGNOSIS — R509 Fever, unspecified: Secondary | ICD-10-CM | POA: Diagnosis present

## 2016-08-03 DIAGNOSIS — C50919 Malignant neoplasm of unspecified site of unspecified female breast: Secondary | ICD-10-CM

## 2016-08-03 DIAGNOSIS — Z90710 Acquired absence of both cervix and uterus: Secondary | ICD-10-CM

## 2016-08-03 DIAGNOSIS — Z9221 Personal history of antineoplastic chemotherapy: Secondary | ICD-10-CM

## 2016-08-03 LAB — URINALYSIS, COMPLETE (UACMP) WITH MICROSCOPIC
Bilirubin Urine: NEGATIVE
Glucose, UA: NEGATIVE mg/dL
Hgb urine dipstick: NEGATIVE
Ketones, ur: 5 mg/dL — AB
Nitrite: NEGATIVE
Protein, ur: NEGATIVE mg/dL
Specific Gravity, Urine: 1.009 (ref 1.005–1.030)
pH: 5 (ref 5.0–8.0)

## 2016-08-03 LAB — CBC WITH DIFFERENTIAL/PLATELET
Basophils Absolute: 0 10*3/uL (ref 0–0.1)
Basophils Relative: 0 %
Eosinophils Absolute: 0 10*3/uL (ref 0–0.7)
Eosinophils Relative: 0 %
HCT: 31.1 % — ABNORMAL LOW (ref 35.0–47.0)
Hemoglobin: 11.1 g/dL — ABNORMAL LOW (ref 12.0–16.0)
Lymphocytes Relative: 5 %
Lymphs Abs: 0.2 10*3/uL — ABNORMAL LOW (ref 1.0–3.6)
MCH: 33.3 pg (ref 26.0–34.0)
MCHC: 35.8 g/dL (ref 32.0–36.0)
MCV: 93 fL (ref 80.0–100.0)
Monocytes Absolute: 0 10*3/uL — ABNORMAL LOW (ref 0.2–0.9)
Monocytes Relative: 1 %
Neutro Abs: 3.6 10*3/uL (ref 1.4–6.5)
Neutrophils Relative %: 94 %
Platelets: 270 10*3/uL (ref 150–440)
RBC: 3.35 MIL/uL — ABNORMAL LOW (ref 3.80–5.20)
RDW: 21.2 % — ABNORMAL HIGH (ref 11.5–14.5)
WBC: 3.9 10*3/uL (ref 3.6–11.0)

## 2016-08-03 LAB — LACTIC ACID, PLASMA: Lactic Acid, Venous: 0.7 mmol/L (ref 0.5–1.9)

## 2016-08-03 LAB — COMPREHENSIVE METABOLIC PANEL
ALT: 20 U/L (ref 14–54)
AST: 53 U/L — ABNORMAL HIGH (ref 15–41)
Albumin: 3.5 g/dL (ref 3.5–5.0)
Alkaline Phosphatase: 126 U/L (ref 38–126)
Anion gap: 8 (ref 5–15)
BUN: 21 mg/dL — ABNORMAL HIGH (ref 6–20)
CO2: 25 mmol/L (ref 22–32)
Calcium: 8.3 mg/dL — ABNORMAL LOW (ref 8.9–10.3)
Chloride: 100 mmol/L — ABNORMAL LOW (ref 101–111)
Creatinine, Ser: 1.54 mg/dL — ABNORMAL HIGH (ref 0.44–1.00)
GFR calc Af Amer: 41 mL/min — ABNORMAL LOW (ref >60)
GFR calc non Af Amer: 35 mL/min — ABNORMAL LOW (ref >60)
Glucose, Bld: 118 mg/dL — ABNORMAL HIGH (ref 65–99)
Potassium: 4.5 mmol/L (ref 3.5–5.1)
Sodium: 133 mmol/L — ABNORMAL LOW (ref 135–145)
Total Bilirubin: 1 mg/dL (ref 0.3–1.2)
Total Protein: 7.1 g/dL (ref 6.5–8.1)

## 2016-08-03 LAB — INFLUENZA PANEL BY PCR (TYPE A & B)
Influenza A By PCR: NEGATIVE
Influenza B By PCR: NEGATIVE

## 2016-08-03 MED ORDER — FENTANYL 50 MCG/HR TD PT72
50.0000 ug | MEDICATED_PATCH | TRANSDERMAL | Status: DC
  Administered 2016-08-04: 10:00:00 50 ug via TRANSDERMAL
  Filled 2016-08-03: qty 1

## 2016-08-03 MED ORDER — CITALOPRAM HYDROBROMIDE 20 MG PO TABS
10.0000 mg | ORAL_TABLET | Freq: Every day | ORAL | Status: DC
  Administered 2016-08-03 – 2016-08-06 (×4): 10 mg via ORAL
  Filled 2016-08-03 (×5): qty 1

## 2016-08-03 MED ORDER — FENTANYL 75 MCG/HR TD PT72: 75.0000 ug | MEDICATED_PATCH | TRANSDERMAL | Status: DC

## 2016-08-03 MED ORDER — SODIUM CHLORIDE 0.9 % IV BOLUS (SEPSIS)
1000.0000 mL | Freq: Once | INTRAVENOUS | Status: AC
  Administered 2016-08-03: 1000 mL via INTRAVENOUS

## 2016-08-03 MED ORDER — DOCUSATE SODIUM 100 MG PO CAPS
100.0000 mg | ORAL_CAPSULE | Freq: Every day | ORAL | Status: DC
  Administered 2016-08-03 – 2016-08-04 (×2): 100 mg via ORAL
  Filled 2016-08-03 (×2): qty 1

## 2016-08-03 MED ORDER — CEFEPIME-DEXTROSE 2 GM/50ML IV SOLR
2.0000 g | Freq: Two times a day (BID) | INTRAVENOUS | Status: DC
Start: 2016-08-03 — End: 2016-08-03

## 2016-08-03 MED ORDER — GABAPENTIN 100 MG PO CAPS
100.0000 mg | ORAL_CAPSULE | Freq: Every morning | ORAL | Status: DC
  Administered 2016-08-04 – 2016-08-06 (×3): 100 mg via ORAL
  Filled 2016-08-03 (×3): qty 1

## 2016-08-03 MED ORDER — VANCOMYCIN HCL IN DEXTROSE 1-5 GM/200ML-% IV SOLN
INTRAVENOUS | Status: AC
  Filled 2016-08-03: qty 200

## 2016-08-03 MED ORDER — CEFEPIME-DEXTROSE 1 GM/50ML IV SOLR
1.0000 g | Freq: Once | INTRAVENOUS | Status: AC
  Administered 2016-08-03: 1 g via INTRAVENOUS
  Filled 2016-08-03 (×2): qty 50

## 2016-08-03 MED ORDER — VITAMIN B-6 50 MG PO TABS
ORAL_TABLET | Freq: Every day | ORAL | Status: DC
  Administered 2016-08-03 – 2016-08-06 (×4): 50 mg via ORAL
  Filled 2016-08-03 (×4): qty 5

## 2016-08-03 MED ORDER — CEFEPIME-DEXTROSE 1 GM/50ML IV SOLR: 1.0000 g | Freq: Once | INTRAVENOUS | Status: DC

## 2016-08-03 MED ORDER — VANCOMYCIN HCL 10 G IV SOLR: 1500.0000 mg | Freq: Once | INTRAVENOUS | Status: DC

## 2016-08-03 MED ORDER — VANCOMYCIN HCL 10 G IV SOLR
1250.0000 mg | Freq: Once | INTRAVENOUS | Status: AC
  Administered 2016-08-03: 1250 mg via INTRAVENOUS
  Filled 2016-08-03: qty 1250

## 2016-08-03 MED ORDER — LORATADINE 10 MG PO TABS
10.0000 mg | ORAL_TABLET | Freq: Every day | ORAL | Status: DC
Start: 2016-08-03 — End: 2016-08-06
  Administered 2016-08-03 – 2016-08-06 (×4): 10 mg via ORAL
  Filled 2016-08-03 (×5): qty 1

## 2016-08-03 MED ORDER — SODIUM CHLORIDE 0.9 % IV SOLN
INTRAVENOUS | Status: DC
  Administered 2016-08-03 – 2016-08-04 (×3): via INTRAVENOUS

## 2016-08-03 MED ORDER — PANTOPRAZOLE SODIUM 40 MG PO TBEC
40.0000 mg | DELAYED_RELEASE_TABLET | Freq: Every day | ORAL | Status: DC
  Administered 2016-08-03 – 2016-08-05 (×3): 40 mg via ORAL
  Filled 2016-08-03 (×3): qty 1

## 2016-08-03 MED ORDER — TRAMADOL HCL 50 MG PO TABS: 50.0000 mg | ORAL_TABLET | Freq: Four times a day (QID) | ORAL | Status: DC | PRN

## 2016-08-03 MED ORDER — OXYCODONE HCL 5 MG PO TABS: 10.0000 mg | ORAL_TABLET | Freq: Three times a day (TID) | ORAL | Status: DC | PRN

## 2016-08-03 MED ORDER — ACETAMINOPHEN 325 MG PO TABS
650.0000 mg | ORAL_TABLET | Freq: Four times a day (QID) | ORAL | Status: DC | PRN
  Administered 2016-08-03: 650 mg via ORAL
  Filled 2016-08-03: qty 2

## 2016-08-03 MED ORDER — CEFEPIME-DEXTROSE 1 GM/50ML IV SOLR: 1.0000 g | INTRAVENOUS | Status: DC

## 2016-08-03 MED ORDER — ONDANSETRON HCL 4 MG PO TABS: 4.0000 mg | ORAL_TABLET | Freq: Four times a day (QID) | ORAL | Status: DC | PRN

## 2016-08-03 MED ORDER — DEXTROSE 5 % IV SOLN
2.0000 g | Freq: Two times a day (BID) | INTRAVENOUS | Status: DC
  Administered 2016-08-03 – 2016-08-06 (×6): 2 g via INTRAVENOUS
  Filled 2016-08-03 (×8): qty 2

## 2016-08-03 MED ORDER — CEFEPIME-DEXTROSE 2 GM/50ML IV SOLR
2.0000 g | Freq: Two times a day (BID) | INTRAVENOUS | Status: DC
  Filled 2016-08-03: qty 50

## 2016-08-03 MED ORDER — CEFEPIME-DEXTROSE 1 GM/50ML IV SOLR
1.0000 g | Freq: Once | INTRAVENOUS | Status: AC
  Administered 2016-08-03: 18:00:00 1 g via INTRAVENOUS
  Filled 2016-08-03: qty 50

## 2016-08-03 MED ORDER — IBUPROFEN 400 MG PO TABS
400.0000 mg | ORAL_TABLET | Freq: Once | ORAL | Status: AC
  Administered 2016-08-03: 400 mg via ORAL
  Filled 2016-08-03 (×2): qty 1

## 2016-08-03 MED ORDER — CYCLOBENZAPRINE HCL 10 MG PO TABS: 10.0000 mg | ORAL_TABLET | Freq: Three times a day (TID) | ORAL | Status: DC | PRN

## 2016-08-03 MED ORDER — ACETAMINOPHEN 500 MG PO TABS
1000.0000 mg | ORAL_TABLET | Freq: Once | ORAL | Status: AC
  Administered 2016-08-03: 1000 mg via ORAL
  Filled 2016-08-03: qty 2

## 2016-08-03 MED ORDER — VANCOMYCIN HCL IN DEXTROSE 750-5 MG/150ML-% IV SOLN
750.0000 mg | INTRAVENOUS | Status: DC
  Administered 2016-08-04: 750 mg via INTRAVENOUS
  Filled 2016-08-03: qty 150

## 2016-08-03 MED ORDER — ONDANSETRON HCL 4 MG/2ML IJ SOLN
4.0000 mg | Freq: Four times a day (QID) | INTRAMUSCULAR | Status: DC | PRN
  Administered 2016-08-05: 21:00:00 4 mg via INTRAVENOUS
  Filled 2016-08-03: qty 2

## 2016-08-03 MED ORDER — ENOXAPARIN SODIUM 40 MG/0.4ML ~~LOC~~ SOLN
40.0000 mg | SUBCUTANEOUS | Status: DC
  Administered 2016-08-03 – 2016-08-05 (×3): 40 mg via SUBCUTANEOUS
  Filled 2016-08-03 (×3): qty 0.4

## 2016-08-03 MED ORDER — VITAMIN B-12 100 MCG PO TABS
100.0000 ug | ORAL_TABLET | Freq: Every day | ORAL | Status: DC
  Administered 2016-08-03 – 2016-08-06 (×4): 100 ug via ORAL
  Filled 2016-08-03 (×4): qty 1

## 2016-08-03 MED ORDER — ACETAMINOPHEN 650 MG RE SUPP: 650.0000 mg | Freq: Four times a day (QID) | RECTAL | Status: DC | PRN

## 2016-08-03 MED ORDER — GABAPENTIN 100 MG PO CAPS
200.0000 mg | ORAL_CAPSULE | Freq: Every day | ORAL | Status: DC
  Administered 2016-08-03 – 2016-08-05 (×3): 200 mg via ORAL
  Filled 2016-08-03 (×3): qty 2

## 2016-08-03 MED ORDER — TBO-FILGRASTIM 300 MCG/0.5ML ~~LOC~~ SOSY
300.0000 ug | PREFILLED_SYRINGE | Freq: Once | SUBCUTANEOUS | Status: AC
  Administered 2016-08-03: 23:00:00 300 ug via SUBCUTANEOUS
  Filled 2016-08-03: qty 0.5

## 2016-08-03 MED ORDER — VANCOMYCIN HCL 10 G IV SOLR: 1250.0000 mg | Freq: Once | INTRAVENOUS | Status: DC

## 2016-08-03 NOTE — ED Provider Notes (Signed)
[facility] Emergency Department Provider Note  ____________________________________________  Time seen: Approximately 10:01 AM  I have reviewed the triage vital signs and the nursing notes.   HISTORY  Chief Complaint Fever   HPI Cristina David is a 61 y.o. female with a history of breast cancer metastasized to liver and bone currently on chemotherapy who presents for evaluation of fever. Patient reports that the fever started yesterday and is associated with chills, body aches, and headache. Fever as high as 103 at home. No coughing, sore throat, shortness of breath, chest pain, abdominal pain, nausea, vomiting, diarrhea. Patient is on a weekly chemotherapy with the last one being 2 days ago. Patient does not have a port. Has not received a flu or pneumonia vaccinations for the last few years because of her cancer.  Past Medical History:  Diagnosis Date  . Cancer [facility])     Patient Active Problem List   Diagnosis Date Noted  . Febrile illness 08/03/2016  . Personal history of goiter 07/31/2016  . Absolute anemia 07/31/2016  . Acid reflux 01/28/2015  . 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 Arthropathia 01/25/2015  . Back pain, chronic 01/25/2015  . Malignant neoplasm of breast (female) ([place]) 01/25/2015  . Anxiety, generalized 01/25/2015  . Gastro-esophageal reflux disease without esophagitis 01/25/2015  . Abnormal maternal glucose tolerance, complicating pregnancy [id number]  . Personal history of antineoplastic chemotherapy 01/25/2015  . Herpes simplex 01/25/2015  . History of endocrine, metabolic or immunity disorder 01/25/2015  . Adaptive colitis 01/25/2015  . Cannot sleep 01/25/2015  . Decreased leukocytes 01/25/2015  . Headache, migraine 01/25/2015  . Clinical depression 01/25/2015  . Thyroid nodule 01/25/2015  . Psoriasis 01/25/2015  . Breast CA ([place]) 05/20/2013  . Chronic pain associated with significant psychosocial dysfunction  07/24/2012  . 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 Continuous opioid dependence ([place]) 07/24/2012  . Vertebral compression fracture ([place]) 07/24/2012  . Primary breast cancer ([place]) 02/06/2012  . Bone metastases ([place]) 12/20/2011    Past Surgical History:  Procedure Laterality Date  . ABDOMINAL HYSTERECTOMY     with oophorectomy  . BREAST LUMPECTOMY Right   . BREAST RECONSTRUCTION Right   . CHOLECYSTECTOMY    . DERMOID CYST  EXCISION    . ENDOMETRIAL ABLATION    . EXPLORATORY LAPAROTOMY      Prior to Admission medications   Medication Sig Start Date End Date Taking? Authorizing Provider  citalopram (CELEXA) 10 MG tablet Take 10 mg by mouth daily.   Yes Historical Provider, MD  cyanocobalamin 100 MCG tablet Take by mouth. 11/27/12  Yes Historical Provider, MD  cyclobenzaprine (FLEXERIL) 10 MG tablet Take 1 tablet (10 mg total) by mouth 3 (three) times daily as needed for muscle spasms. 08/04/15  Yes Richard Maceo Pro., MD  denosumab (XGEVA) 120 MG/1.7ML SOLN injection Inject 120 mg into the skin every 6 (six) weeks.    Yes Historical Provider, MD  fentaNYL (DURAGESIC - DOSED MCG/HR) 50 MCG/HR Place 50 mcg onto the skin every 3 (three) days.   Yes Historical Provider, MD  gabapentin (NEURONTIN) 100 MG capsule Take 100-300 mg by mouth 2 (two) times daily. Pt takes 1 cap qam and 2 Caps qhs   Yes Historical Provider, MD  glucosamine-chondroitin 500-400 MG tablet Take 3 tablets by mouth daily.   Yes Historical Provider, MD  LORazepam (ATIVAN) 0.5 MG tablet Take 1 tablet (0.5 mg total) by mouth 3 (three) times daily as needed. 04/06/16  Yes Richard Maceo Pro.,  MD  NUTRITIONAL SUPPLEMENTS PO Take by mouth. 11/27/12  Yes Historical Provider, MD  omeprazole (PRILOSEC) 20 MG capsule TAKE ONE CAPSULE BY MOUTH TWICE A DAY 06/14/16  Yes Richard Maceo Pro., MD  Oxycodone HCl 10 MG TABS Take 10 mg by mouth every 8 (eight) hours as needed.   Yes Historical Provider, MD  Pyridoxine HCl (VITAMIN B6 PO)  Take 1 tablet by mouth daily.  11/27/12  Yes Historical Provider, MD  SUMAtriptan (IMITREX) 100 MG tablet 1 tablet daily as needed May repeat in 2 hours if headache persists or recurs. 05/30/16  Yes Richard Maceo Pro., MD  docusate sodium (STOOL SOFTENER) 100 MG capsule Take 100 mg by mouth.    Historical Provider, MD  fentaNYL (DURAGESIC - DOSED MCG/HR) 75 MCG/HR Place 1 patch (75 mcg total) onto the skin every 3 (three) days. Patient not taking: Reported on 08/03/2016 07/31/16   Jerrol Banana., MD  fexofenadine (ALLEGRA) 180 MG tablet Take 180 mg by mouth daily.    Historical Provider, MD  meclizine (ANTIVERT) 25 MG tablet Take 1 tablet (25 mg total) by mouth 3 (three) times daily as needed for dizziness. 09/08/15   Richard Maceo Pro., MD    Allergies Docetaxel and Prochlorperazine  Family History  Problem Relation Age of Onset  . Arthritis Mother   . Prostate cancer Father   . Asthma Father   . Breast cancer Maternal Aunt     Social History Social History  Substance Use Topics  . Smoking status: Never Smoker  . Smokeless tobacco: Never Used  . Alcohol use Yes     Comment: 1 glass of wine amonth    Review of Systems  Constitutional: + fever and body aches Eyes: Negative for visual changes. ENT: Negative for sore throat. Neck: No neck pain  Cardiovascular: Negative for chest pain. Respiratory: Negative for shortness of breath. Gastrointestinal: Negative for abdominal pain, vomiting or diarrhea. Genitourinary: Negative for dysuria. Musculoskeletal: Negative for back pain. Skin: Negative for rash. Neurological: Negative for weakness or numbness. + HA Psych: No SI or HI  ____________________________________________   PHYSICAL EXAM:  VITAL SIGNS: ED Triage Vitals  Enc Vitals Group     BP 08/03/16 0917 (!) 122/58     Pulse Rate 08/03/16 0917 (!) 102     Resp 08/03/16 0917 18     Temp 08/03/16 0917 (!) 101.2 F (38.4 C)     Temp Source 08/03/16 0917 Oral      SpO2 08/03/16 0917 97 %     Weight 08/03/16 0917 140 lb (63.5 kg)     Height 08/03/16 0917 5\' 3"  (1.6 m)     Head Circumference --      Peak Flow --      Pain Score 08/03/16 0909 6     Pain Loc --      Pain Edu? --      Excl. in [place]? --     Constitutional: Alert and oriented. Well appearing and in no apparent distress. HEENT:      Head: Normocephalic and atraumatic.         Eyes: Conjunctivae are normal. Sclera is non-icteric. EOMI. PERRL      Mouth/Throat: Mucous membranes are moist.       Neck: Supple with no signs of meningismus. Cardiovascular: Tachycardia with regular rhythm. No murmurs, gallops, or rubs. 2+ symmetrical distal pulses are present in all extremities. No JVD. Respiratory: Normal respiratory effort. Lungs are clear to auscultation  bilaterally. No wheezes, crackles, or rhonchi.  Gastrointestinal: Soft, non tender, and non distended with positive bowel sounds. No rebound or guarding. Musculoskeletal: Nontender with normal range of motion in all extremities. No edema, cyanosis, or erythema of extremities. Neurologic: Normal speech and language. Face is symmetric. Moving all extremities. No gross focal neurologic deficits are appreciated. Skin: Skin is warm, dry and intact. No rash noted. Psychiatric: Mood and affect are normal. Speech and behavior are normal.  ____________________________________________   LABS (all labs ordered are listed, but only abnormal results are displayed)  Labs Reviewed  COMPREHENSIVE METABOLIC PANEL - Abnormal; Notable for the following:       Result Value   Sodium 133 (*)    Chloride 100 (*)    Glucose, Bld 118 (*)    BUN 21 (*)    Creatinine, Ser 1.54 (*)    Calcium 8.3 (*)    AST 53 (*)    GFR calc non Af Amer 35 (*)    GFR calc Af Amer 41 (*)    All other components within normal limits  CBC WITH DIFFERENTIAL/PLATELET - Abnormal; Notable for the following:    RBC 3.35 (*)    Hemoglobin 11.1 (*)    HCT 31.1 (*)    RDW 21.2  (*)    Lymphs Abs 0.2 (*)    Monocytes Absolute 0.0 (*)    All other components within normal limits  URINALYSIS, COMPLETE (UACMP) WITH MICROSCOPIC - Abnormal; Notable for the following:    Color, Urine YELLOW (*)    APPearance CLEAR (*)    Ketones, ur 5 (*)    Leukocytes, UA MODERATE (*)    Bacteria, UA RARE (*)    Squamous Epithelial / LPF 0-5 (*)    All other components within normal limits  CULTURE, BLOOD (ROUTINE X 2)  CULTURE, BLOOD (ROUTINE X 2)  URINE CULTURE  LACTIC ACID, PLASMA  INFLUENZA PANEL BY PCR (TYPE A & B)   ____________________________________________  EKG  ED ECG REPORT I, Rudene Re, the attending physician, personally viewed and interpreted this ECG.   Normal sinus rhythm, rate of 93, normal intervals, normal axis, no ST elevations or depressions. ____________________________________________  RADIOLOGY  CXR: No infiltrate  ____________________________________________   PROCEDURES  Procedure(s) performed: None Procedures Critical Care performed: yes  CRITICAL CARE Performed by: Rudene Re  ?  Total critical care time: 35 min  Critical care time was exclusive of separately billable procedures and treating other patients.  Critical care was necessary to treat or prevent imminent or life-threatening deterioration.  Critical care was time spent personally by me on the following activities: development of treatment plan with patient and/or surrogate as well as nursing, discussions with consultants, evaluation of patient's response to treatment, examination of patient, obtaining history from patient or surrogate, ordering and performing treatments and interventions, ordering and review of laboratory studies, ordering and review of radiographic studies, pulse oximetry and re-evaluation of patient's condition.  ____________________________________________   INITIAL IMPRESSION / ASSESSMENT AND PLAN / ED COURSE  61 y.o. female with a  history of breast cancer metastasized to liver and bone currently on chemotherapy who presents for evaluation of fever since last night. Patient meets sepsis criteria with a fever of 101.2 and tachycardia with pulse of 102. She is otherwise well-appearing with no focal findings on exam. Patient was started on sepsis protocol with IV fluids, Tylenol, will start patient on cefepime and vancomycin, will stop for the flu, chest x-ray, UA, blood cultures, lactate.  Anticipate admission.   ED COURSE: Vital signs improved with IV fluids. UA concerning for urinary tract infection, flu negative, chest x-ray with no evidence of pneumonia. Patient also with a KI with creatinine of 1.5 (baseline 0.7). Lactic acid was within normal limits. Patient was admitted to the hospitalist for IV abx.   Pertinent labs & imaging results that were available during my care of the patient were reviewed by me and considered in my medical decision making (see chart for details).    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    Rudene Re, MD 08/03/16 [phone]

## 2016-08-03 NOTE — Progress Notes (Signed)
Pharmacy Antibiotic Note  BAYLEY DEXHEIMER is a 61 y.o. female admitted on 08/03/2016 with sepsis /fever in cancer pt s/p chemo 2 days ago.  Pharmacy has been consulted for Vancomycin and Cefepime dosing.   Plan: Patient received Vancomycin 1250mg  IV x 1 in ER and Cefepime 1 gram iV x 1. Will order Vancomycin with stacked dosing. Vancomycin 750 IV every 24 hours.  Goal trough 15-20 mcg/mL.  Ke 0.030  T1/2 23  Vd 44     Will order trough prior to 4th dose on 1/28.  Will order another Cefepime 1 gram x 1, for a total dose of Cefepime 2 gram. Will continue with Cefepime 2 gram IV q12h based on Crcl of 34 ml/min and covering empirically for possible febrile neutropenia.   Height: 5\' 3"  (160 cm) Weight: 140 lb (63.5 kg) IBW/kg (Calculated) : 52.4  Temp (24hrs), Avg:99.8 F (37.7 C), Min:98.4 F (36.9 C), Max:101.2 F (38.4 C)   Recent Labs Lab 08/03/16 0944 08/03/16 0945  WBC 3.9  --   CREATININE 1.54*  --   LATICACIDVEN  --  0.7    Estimated Creatinine Clearance: 34 mL/min (by C-G formula based on SCr of 1.54 mg/dL (H)).    Allergies  Allergen Reactions  . Docetaxel Hives and Rash  . Prochlorperazine Other (See Comments)    MUSCLE TWITCHING    Antimicrobials this admission: Vanc 1/25 >>   Cefepime 1/25 >>    Dose adjustments this admission:    Microbiology results: 1/25 BCx: pend 1/25 UCx: pend    Sputum:    1/25 MRSA PCR: pend 1/25 Influenza pending  Thank you for allowing pharmacy to be a part of this patient's care.  Allessandra Bernardi A 08/03/2016 2:48 PM

## 2016-08-03 NOTE — Consult Note (Signed)
[facility]  Date of admission:  08/03/2016  Inpatient day:  08/03/2016  Consulting physician:  Dr. Fritzi Mandes  Reason for Consultation:  Febrile illness.  Chief Complaint: Cristina David is a 61 y.o. female with metastatic breast cancer who was admitted throught the emergency room with fever on day 3 s/p cycle #1 gemcitabine.  HPI:  The patient was diagnosed with right breast cancer in 2011.  She received neoadjuvant CAF x 3, Taxotere x 1, and Abraxane x 2.  Chemotherapy completed on 08/03/2009.  She underwent right modified radical mastectomy on 10/07/2009 followed by radiation therapy (completed 12/10/2009).  She was treated with Arimidex, but later switched to Femara secondary to side effects.  She presented in 12/2011 with metastatic disease in 2013 in T3 and T4 disease with epidural extension and a left 4th rib lesion.  She received palliative radiation.  She then received Affinitor with Aromasin then tamoxifen.  She then received Faslodex.  With progression on bone scan, she received Xeloda beginning 05/2013.  She received additional palliative radiation.    She enrolled on the [id number] trial in 07/2013.  She then received Xeloda, followed by Femara then carboplatin and Abraxane beginning in 08/2014.  She received palliative radiation to the skull.  She began Doxil in 11/2015.  With progression, she received Ibrance and Femara beginning on 05/09/2016.  Counts took 3 weeks to recover with Ibrance.  Follow-up imaging revealed progression in the liver ("2 lesions to 11 lesions").  She began single agent gemcitabine on 08/01/2016.  She received a PRBC transfusion that day.  Plan was for GCSF x 3 days beginning today (Thursday, Friday, and Saturday).  She describes awakening last night with fever and chills.  Temperature was 104.  She was delirious.  She denied any runny nose, sore throat, cough, change in bowel, or urinary symptoms.  She denies any exposure to sick  individuals.  She was seen in the ER.  CBC revealed a hematocrit of 31.1, hemoglobin 11.1, platelets 270,000, WBC 3900 with an ANC of 3600.  Lactic acid was normal.  CXR was negative.  Urinalysis revealed moderate leukocytes, 6-30 WBCs and rare bacteria.  Blood and urine culture are negative to date.  She was empirically placed on vancomycin and Cefepime.  She feels better today.   Past Medical History:  Diagnosis Date  . Cancer [facility])     Past Surgical History:  Procedure Laterality Date  . ABDOMINAL HYSTERECTOMY     with oophorectomy  . BREAST LUMPECTOMY Right   . BREAST RECONSTRUCTION Right   . CHOLECYSTECTOMY    . DERMOID CYST  EXCISION    . ENDOMETRIAL ABLATION    . EXPLORATORY LAPAROTOMY      Family History  Problem Relation Age of Onset  . Arthritis Mother   . Prostate cancer Father   . Asthma Father   . Breast cancer Maternal Aunt     Social History:  reports that she has never smoked. She has never used smokeless tobacco. She reports that she drinks alcohol. She reports that she does not use drugs.  The patient is accompanied by her husband.  Allergies:  Allergies  Allergen Reactions  . Docetaxel Hives and Rash  . Prochlorperazine Other (See Comments)    MUSCLE TWITCHING    Medications Prior to Admission  Medication Sig Dispense Refill  . citalopram (CELEXA) 10 MG tablet Take 10 mg by mouth daily.    . cyanocobalamin 100 MCG tablet Take by mouth.    Marland Kitchen  cyclobenzaprine (FLEXERIL) 10 MG tablet Take 1 tablet (10 mg total) by mouth 3 (three) times daily as needed for muscle spasms. 90 tablet 0  . denosumab (XGEVA) 120 MG/1.7ML SOLN injection Inject 120 mg into the skin every 6 (six) weeks.     . fentaNYL (DURAGESIC - DOSED MCG/HR) 50 MCG/HR Place 50 mcg onto the skin every 3 (three) days.    Marland Kitchen gabapentin (NEURONTIN) 100 MG capsule Take 100-300 mg by mouth 2 (two) times daily. Pt takes 1 cap qam and 2 Caps qhs    . glucosamine-chondroitin 500-400 MG tablet Take 3  tablets by mouth daily.    Marland Kitchen LORazepam (ATIVAN) 0.5 MG tablet Take 1 tablet (0.5 mg total) by mouth 3 (three) times daily as needed. 90 tablet 5  . NUTRITIONAL SUPPLEMENTS PO Take by mouth.    Marland Kitchen omeprazole (PRILOSEC) 20 MG capsule TAKE ONE CAPSULE BY MOUTH TWICE A DAY 180 capsule 3  . Oxycodone HCl 10 MG TABS Take 10 mg by mouth every 8 (eight) hours as needed.    . Pyridoxine HCl (VITAMIN B6 PO) Take 1 tablet by mouth daily.     . SUMAtriptan (IMITREX) 100 MG tablet 1 tablet daily as needed May repeat in 2 hours if headache persists or recurs. 9 tablet 12  . docusate sodium (STOOL SOFTENER) 100 MG capsule Take 100 mg by mouth.    . fentaNYL (DURAGESIC - DOSED MCG/HR) 75 MCG/HR Place 1 patch (75 mcg total) onto the skin every 3 (three) days. (Patient not taking: Reported on 08/03/2016) 10 patch 0  . fexofenadine (ALLEGRA) 180 MG tablet Take 180 mg by mouth daily.    . meclizine (ANTIVERT) 25 MG tablet Take 1 tablet (25 mg total) by mouth 3 (three) times daily as needed for dizziness. 30 tablet 0    Review of Systems: GENERAL:  Feels better.  Fever and chills resolved.  No sweats. PERFORMANCE STATUS (ECOG):  1 HEENT:  No visual changes, runny nose, sore throat, mouth sores or tenderness. Lungs: No shortness of breath or cough.  No hemoptysis. Cardiac:  No chest pain, palpitations, orthopnea, or PND. GI:  Chronic nausea.  No vomiting, diarrhea, constipation, melena or hematochezia. GU:  No urgency, frequency, dysuria, or hematuria. Musculoskeletal: Bone pain secondary to metastatic disease.  No muscle tenderness. Extremities:  No pain or swelling. Skin:  No rashes or skin changes. Neuro:  No headache, numbness or weakness, balance or coordination issues. Endocrine:  No diabetes, thyroid issues, hot flashes or night sweats. Psych:  No mood changes, depression or anxiety. Pain:  No focal pain. Review of systems:  All other systems reviewed and found to be negative.  Physical Exam:  Blood  pressure (!) 128/50, pulse (!) 118, temperature (!) 103.1 F (39.5 C), temperature source Oral, resp. rate 18, height 5' 3.5" (1.613 m), weight 144 lb 7 oz (65.5 kg), SpO2 93 %.  GENERAL:  Well developed, well nourished, woman sitting comfortably on the medical unit in no acute distress. MENTAL STATUS:  Alert and oriented to person, place and time. HEAD:  Short gray hair.  Normocephalic, atraumatic, face symmetric, no Cushingoid features. EYES:  Blue eyes.  Pupils equal round and reactive to light and accomodation.  No conjunctivitis or scleral icterus. ENT:  Oropharynx clear without lesion.  Tongue normal. Mucous membranes moist.  RESPIRATORY:  Clear to auscultation without rales, wheezes or rhonchi. CARDIOVASCULAR:  Regular rate and rhythm without murmur, rub or gallop. ABDOMEN:  Soft, non-tender, with active bowel sounds,  and no hepatosplenomegaly.  No masses. SKIN:  No rashes, ulcers or lesions. EXTREMITIES: Right upper extremity lymphedema sleeve.  No edema, no skin discoloration or tenderness.  No palpable cords. LYMPH NODES: No palpable cervical, supraclavicular, axillary or inguinal adenopathy  NEUROLOGICAL: Unremarkable. PSYCH:  Appropriate.   Results for orders placed or performed during the hospital encounter of 08/03/16 (from the past 48 hour(s))  Comprehensive metabolic panel     Status: Abnormal   Collection Time: 08/03/16  9:44 AM  Result Value Ref Range   Sodium 133 (L) 135 - 145 mmol/L   Potassium 4.5 3.5 - 5.1 mmol/L   Chloride 100 (L) 101 - 111 mmol/L   CO2 25 22 - 32 mmol/L   Glucose, Bld 118 (H) 65 - 99 mg/dL   BUN 21 (H) 6 - 20 mg/dL   Creatinine, Ser 1.54 (H) 0.44 - 1.00 mg/dL   Calcium 8.3 (L) 8.9 - 10.3 mg/dL   Total Protein 7.1 6.5 - 8.1 g/dL   Albumin 3.5 3.5 - 5.0 g/dL   AST 53 (H) 15 - 41 U/L   ALT 20 14 - 54 U/L   Alkaline Phosphatase 126 38 - 126 U/L   Total Bilirubin 1.0 0.3 - 1.2 mg/dL   GFR calc non Af Amer 35 (L) >60 mL/min   GFR calc Af Amer 41  (L) >60 mL/min    Comment: (NOTE) The eGFR has been calculated using the CKD EPI equation. This calculation has not been validated in all clinical situations. eGFR's persistently <60 mL/min signify possible Chronic Kidney Disease.    Anion gap 8 5 - 15  CBC WITH DIFFERENTIAL     Status: Abnormal   Collection Time: 08/03/16  9:44 AM  Result Value Ref Range   WBC 3.9 3.6 - 11.0 K/uL   RBC 3.35 (L) 3.80 - 5.20 MIL/uL   Hemoglobin 11.1 (L) 12.0 - 16.0 g/dL   HCT 31.1 (L) 35.0 - 47.0 %   MCV 93.0 80.0 - 100.0 fL   MCH 33.3 26.0 - 34.0 pg   MCHC 35.8 32.0 - 36.0 g/dL   RDW 21.2 (H) 11.5 - 14.5 %   Platelets 270 150 - 440 K/uL   Neutrophils Relative % 94 %   Neutro Abs 3.6 1.4 - 6.5 K/uL   Lymphocytes Relative 5 %   Lymphs Abs 0.2 (L) 1.0 - 3.6 K/uL   Monocytes Relative 1 %   Monocytes Absolute 0.0 (L) 0.2 - 0.9 K/uL   Eosinophils Relative 0 %   Eosinophils Absolute 0.0 0 - 0.7 K/uL   Basophils Relative 0 %   Basophils Absolute 0.0 0 - 0.1 K/uL  Lactic acid, plasma     Status: None   Collection Time: 08/03/16  9:45 AM  Result Value Ref Range   Lactic Acid, Venous 0.7 0.5 - 1.9 mmol/L  Urinalysis, Complete w Microscopic     Status: Abnormal   Collection Time: 08/03/16  9:46 AM  Result Value Ref Range   Color, Urine YELLOW (A) YELLOW   APPearance CLEAR (A) CLEAR   Specific Gravity, Urine 1.009 1.005 - 1.030   pH 5.0 5.0 - 8.0   Glucose, UA NEGATIVE NEGATIVE mg/dL   Hgb urine dipstick NEGATIVE NEGATIVE   Bilirubin Urine NEGATIVE NEGATIVE   Ketones, ur 5 (A) NEGATIVE mg/dL   Protein, ur NEGATIVE NEGATIVE mg/dL   Nitrite NEGATIVE NEGATIVE   Leukocytes, UA MODERATE (A) NEGATIVE   RBC / HPF 0-5 0 - 5 RBC/hpf  WBC, UA 6-30 0 - 5 WBC/hpf   Bacteria, UA RARE (A) NONE SEEN   Squamous Epithelial / LPF 0-5 (A) NONE SEEN   Mucous PRESENT    Hyaline Casts, UA PRESENT   Influenza panel by PCR (type A & B)     Status: None   Collection Time: 08/03/16  9:46 AM  Result Value Ref Range    Influenza A By PCR NEGATIVE NEGATIVE   Influenza B By PCR NEGATIVE NEGATIVE    Comment: (NOTE) The Xpert Xpress Flu assay is intended as an aid in the diagnosis of  influenza and should not be used as a sole basis for treatment.  This  assay is FDA approved for nasopharyngeal swab specimens only. Nasal  washings and aspirates are unacceptable for Xpert Xpress Flu testing.    Dg Chest 2 View  Result Date: 08/03/2016 CLINICAL DATA:  Fevers, history of metastatic breast carcinoma EXAM: CHEST  2 VIEW COMPARISON:  09/14/2015 FINDINGS: Cardiac shadow is mildly enlarged. Postsurgical changes are noted on the right consistent prior breast surgery. Multiple areas of prior bony metastatic disease are seen particularly in the left ribcage with mild deformity. The overall appearance is stable. Sclerotic foci in the thoracic spine are noted consistent with metastatic disease as well. No compression deformity is seen. No sizable effusion or focal infiltrate is noted. IMPRESSION: Changes consistent with the bony metastatic disease no acute infiltrate is noted. Electronically Signed   By: Inez Catalina M.D.   On: 08/03/2016 12:42    Assessment:  The patient is a 61 y.o. woman with metastatic breast cancer who was admitted with fever and chills on day 3 s/p cycle #1 gemcitabine.  She has received multiple lines of chemotherapy.   Symptomatically, she has no localizing symptoms.  Urinalysis suggests possible UTI.  Plan:   1.  Oncology:  Metastatic breast cancer to bone and liver.  Day 3 s/p cycle #1 single agent gemcitabine.  Plan per outside oncologist (Dr. Laveda Norman at [facility] in [place], [place]; ph:  [phone]) was for 3 days of GCSF (Thursday, Friday, and Saturday) secondary to anticipated myelosuppression.  Spoke to Dr. Murrell Redden tonight [phone]).  Patient has seen a significant amount of therapy.  2.  Hematology:  Patient is s/p PRBC transfusion on 08/01/2016.  Monitor counts  daily.  3.  Infectious disease:  Patient has been cultured and started on broad spectrum antibiotics (vancomycin and Cefepime).  Follow cultures.  Thank you for allowing me to participate in Cristina David 's care. Dr Delight Hoh will be covering starting tomorrow.   Lequita Asal, MD  08/03/2016, 10:13 PM

## 2016-08-03 NOTE — H&P (Signed)
[place] [facility] [place] NAME: Cristina David    MR#:  [id number]  DATE OF BIRTH:  05-25-1956  DATE OF ADMISSION:  08/03/2016  PRIMARY CARE PHYSICIAN: Wilhemena Durie, MD   REQUESTING/REFERRING PHYSICIAN: Dr Jimmye Norman  CHIEF COMPLAINT:  High-grade fever of 104  HISTORY OF PRESENT ILLNESS:  Cristina David  is a 61 y.o. female with a known history of Metastatic breast cancer, undergoing chemotherapy at oncology Center in [place] comes to the emergency room with fever of 103-104 since yesterday. Patient denies any cough headache abdominal pain dysuria diarrhea or vomiting nausea. She gets weekly chemotherapy her last chemotherapy was couple days ago. She denies any complaints say she is feeling better. She received empirically IV cefepime and vancomycin for febrile illness. Patient is being admitted for further evaluation and management.  PAST MEDICAL HISTORY:   Past Medical History:  Diagnosis Date  . Cancer ([place])     PAST SURGICAL HISTOIRY:   Past Surgical History:  Procedure Laterality Date  . ABDOMINAL HYSTERECTOMY     with oophorectomy  . BREAST LUMPECTOMY Right   . BREAST RECONSTRUCTION Right   . CHOLECYSTECTOMY    . DERMOID CYST  EXCISION    . ENDOMETRIAL ABLATION    . EXPLORATORY LAPAROTOMY      SOCIAL HISTORY:   Social History  Substance Use Topics  . Smoking status: Never Smoker  . Smokeless tobacco: Never Used  . Alcohol use Yes     Comment: 1 glass of wine amonth    FAMILY HISTORY:   Family History  Problem Relation Age of Onset  . Arthritis Mother   . Prostate cancer Father   . Asthma Father   . Breast cancer Maternal Aunt     DRUG ALLERGIES:   Allergies  Allergen Reactions  . Docetaxel Hives and Rash  . Prochlorperazine Other (See Comments)    MUSCLE TWITCHING    REVIEW OF SYSTEMS:  Review of Systems  Constitutional: Negative for chills, fever and weight loss.  HENT: Negative for ear discharge,  ear pain and nosebleeds.   Eyes: Negative for blurred vision, pain and discharge.  Respiratory: Negative for sputum production, shortness of breath, wheezing and stridor.   Cardiovascular: Negative for chest pain, palpitations, orthopnea and PND.  Gastrointestinal: Negative for abdominal pain, diarrhea, nausea and vomiting.  Genitourinary: Negative for frequency and urgency.  Musculoskeletal: Negative for back pain and joint pain.  Neurological: Positive for weakness. Negative for sensory change, speech change and focal weakness.  Psychiatric/Behavioral: Negative for depression and hallucinations. The patient is not nervous/anxious.      MEDICATIONS AT HOME:   Prior to Admission medications   Medication Sig Start Date End Date Taking? Authorizing Provider  citalopram (CELEXA) 10 MG tablet Take 10 mg by mouth daily.   Yes Historical Provider, MD  cyanocobalamin 100 MCG tablet Take by mouth. 11/27/12  Yes Historical Provider, MD  cyclobenzaprine (FLEXERIL) 10 MG tablet Take 1 tablet (10 mg total) by mouth 3 (three) times daily as needed for muscle spasms. 08/04/15  Yes Richard Maceo Pro., MD  denosumab (XGEVA) 120 MG/1.7ML SOLN injection Inject 120 mg into the skin every 6 (six) weeks.    Yes Historical Provider, MD  fentaNYL (DURAGESIC - DOSED MCG/HR) 50 MCG/HR Place 50 mcg onto the skin every 3 (three) days.   Yes Historical Provider, MD  fexofenadine (ALLEGRA) 180 MG tablet Take 180 mg by mouth daily.   Yes Historical Provider, MD  gabapentin (NEURONTIN) 100 MG capsule Take 100-300 mg by mouth 2 (two) times daily. Pt takes 1 cap qam and 2 Caps qhs   Yes Historical Provider, MD  glucosamine-chondroitin 500-400 MG tablet Take 3 tablets by mouth daily.   Yes Historical Provider, MD  LORazepam (ATIVAN) 0.5 MG tablet Take 1 tablet (0.5 mg total) by mouth 3 (three) times daily as needed. 04/06/16  Yes Richard Maceo Pro., MD  NUTRITIONAL SUPPLEMENTS PO Take by mouth. 11/27/12  Yes Historical  Provider, MD  omeprazole (PRILOSEC) 20 MG capsule TAKE ONE CAPSULE BY MOUTH TWICE A DAY 06/14/16  Yes Jerrol Banana., MD  Oxycodone HCl 10 MG TABS Take 10 mg by mouth every 8 (eight) hours as needed.   Yes Historical Provider, MD  Pyridoxine HCl (VITAMIN B6 PO) Take 1 tablet by mouth daily.  11/27/12  Yes Historical Provider, MD  SUMAtriptan (IMITREX) 100 MG tablet 1 tablet daily as needed May repeat in 2 hours if headache persists or recurs. 05/30/16  Yes Richard Maceo Pro., MD  docusate sodium (STOOL SOFTENER) 100 MG capsule Take 100 mg by mouth.    Historical Provider, MD  fentaNYL (DURAGESIC - DOSED MCG/HR) 75 MCG/HR Place 1 patch (75 mcg total) onto the skin every 3 (three) days. 07/31/16   Richard Maceo Pro., MD  meclizine (ANTIVERT) 25 MG tablet Take 1 tablet (25 mg total) by mouth 3 (three) times daily as needed for dizziness. 09/08/15   Richard Maceo Pro., MD      VITAL SIGNS:  Blood pressure (!) 105/56, pulse 80, temperature 98.4 F (36.9 C), resp. rate 12, height 5\' 3"  (1.6 m), weight 63.5 kg (140 lb), SpO2 95 %.  PHYSICAL EXAMINATION:  GENERAL:  61 y.o.-year-old patient lying in the bed with no acute distress.  EYES: Pupils equal, round, reactive to light and accommodation. No scleral icterus. Extraocular muscles intact.  HEENT: Head atraumatic, normocephalic. Oropharynx and nasopharynx clear.  NECK:  Supple, no jugular venous distention. No thyroid enlargement, no tenderness.  LUNGS: Normal breath sounds bilaterally, no wheezing, rales,rhonchi or crepitation. No use of accessory muscles of respiration.  CARDIOVASCULAR: S1, S2 normal. No murmurs, rubs, or gallops.  ABDOMEN: Soft, nontender, nondistended. Bowel sounds present. No organomegaly or mass.  EXTREMITIES: No pedal edema, cyanosis, or clubbing.  NEUROLOGIC: Cranial nerves II through XII are intact. Muscle strength 5/5 in all extremities. Sensation intact. Gait not checked.  PSYCHIATRIC: The patient is alert and  oriented x 3.  SKIN: No obvious rash, lesion, or ulcer.   LABORATORY PANEL:   CBC  Recent Labs Lab 08/03/16 0944  WBC 3.9  HGB 11.1*  HCT 31.1*  PLT 270   ------------------------------------------------------------------------------------------------------------------  Chemistries   Recent Labs Lab 08/03/16 0944  NA 133*  K 4.5  CL 100*  CO2 25  GLUCOSE 118*  BUN 21*  CREATININE 1.54*  CALCIUM 8.3*  AST 53*  ALT 20  ALKPHOS 126  BILITOT 1.0   ------------------------------------------------------------------------------------------------------------------  Cardiac Enzymes No results for input(s): TROPONINI in the last 168 hours. ------------------------------------------------------------------------------------------------------------------  RADIOLOGY:  Dg Chest 2 View  Result Date: 08/03/2016 CLINICAL DATA:  Fevers, history of metastatic breast carcinoma EXAM: CHEST  2 VIEW COMPARISON:  09/14/2015 FINDINGS: Cardiac shadow is mildly enlarged. Postsurgical changes are noted on the right consistent prior breast surgery. Multiple areas of prior bony metastatic disease are seen particularly in the left ribcage with mild deformity. The overall appearance is stable. Sclerotic foci in the thoracic spine are  noted consistent with metastatic disease as well. No compression deformity is seen. No sizable effusion or focal infiltrate is noted. IMPRESSION: Changes consistent with the bony metastatic disease no acute infiltrate is noted. Electronically Signed   By: Inez Catalina M.D.   On: 08/03/2016 12:42    EKG:    IMPRESSION AND PLAN:   Cristina David  is a 61 y.o. female with a known history of Metastatic breast cancer, undergoing chemotherapy at oncology Center in [place] comes to the emergency room with fever of 103-104 since yesterday. Patient denies any cough headache abdominal pain dysuria diarrhea or vomiting nausea. She gets weekly chemotherapy her last chemotherapy was  couple days ago.   1. Febrile illness in the setting of patient getting chemotherapy for metastatic breast cancer -Admit to medical  -IV vancomycin and cefepime -Follow blood culture urine culture -Monitor fever curve. When necessary Tylenol. -The patient's white cell count is within normal limits however we'll keep her on neutropenic precaution -Oncology consultation  2. Metastatic breast cancer patient gets her treatment in [place] -Oncology to see -Patient reports she was supposed to get Neupogen shot today. I will defer to oncology  3. Generalized weakness -Continue IV fluids  4. DVT prophylaxis subcutaneous Lovenox   All the records are reviewed and case discussed with ED provider. Management plans discussed with the patient, family and they are in agreement.  CODE STATUS: full  TOTAL TIME TAKING CARE OF THIS PATIENT: 50 minutes.    Cristina David M.D on 08/03/2016 at 1:57 PM  Between 7am to 6pm - Pager - [phone]  After 6pm go to [email or web address] - password [email or web address] [facility]  [facility]  Office  [phone]  CC: Primary care physician; Wilhemena Durie, MD

## 2016-08-03 NOTE — Progress Notes (Signed)
The order said that patient needed to create an Advance Directive, but patient said that she needed [place] as she was told by one of the nurses. Chaplain told the patient that he was not aware of the new [place] that she was told by a nurse.

## 2016-08-03 NOTE — ED Triage Notes (Signed)
Pt is on chemo for breast cancer, states last night she began to have a fever, reports 103 at home, states headache, cheeks red

## 2016-08-03 NOTE — ED Notes (Addendum)
Report received from [place]. Pt will be admitted for fever. Influenza negative. Antibiotics infused and ns infusing. Pt just returned from xr.

## 2016-08-04 LAB — CBC WITH DIFFERENTIAL/PLATELET
Basophils Absolute: 0 10*3/uL (ref 0–0.1)
Basophils Relative: 0 %
Eosinophils Absolute: 0 10*3/uL (ref 0–0.7)
Eosinophils Relative: 0 %
HCT: 26.5 % — ABNORMAL LOW (ref 35.0–47.0)
Hemoglobin: 9.7 g/dL — ABNORMAL LOW (ref 12.0–16.0)
Lymphocytes Relative: 5 %
Lymphs Abs: 0.1 10*3/uL — ABNORMAL LOW (ref 1.0–3.6)
MCH: 34.2 pg — ABNORMAL HIGH (ref 26.0–34.0)
MCHC: 36.5 g/dL — ABNORMAL HIGH (ref 32.0–36.0)
MCV: 93.8 fL (ref 80.0–100.0)
Monocytes Absolute: 0 10*3/uL — ABNORMAL LOW (ref 0.2–0.9)
Monocytes Relative: 1 %
Neutro Abs: 2.7 10*3/uL (ref 1.4–6.5)
Neutrophils Relative %: 94 %
Platelets: 188 10*3/uL (ref 150–440)
RBC: 2.82 MIL/uL — ABNORMAL LOW (ref 3.80–5.20)
RDW: 20.8 % — ABNORMAL HIGH (ref 11.5–14.5)
WBC: 2.8 10*3/uL — ABNORMAL LOW (ref 3.6–11.0)

## 2016-08-04 LAB — URINE CULTURE: Culture: NO GROWTH

## 2016-08-04 LAB — BASIC METABOLIC PANEL
Anion gap: 5 (ref 5–15)
BUN: 18 mg/dL (ref 6–20)
CO2: 23 mmol/L (ref 22–32)
Calcium: 7.5 mg/dL — ABNORMAL LOW (ref 8.9–10.3)
Chloride: 111 mmol/L (ref 101–111)
Creatinine, Ser: 1.29 mg/dL — ABNORMAL HIGH (ref 0.44–1.00)
GFR calc Af Amer: 50 mL/min — ABNORMAL LOW (ref >60)
GFR calc non Af Amer: 43 mL/min — ABNORMAL LOW (ref >60)
Glucose, Bld: 112 mg/dL — ABNORMAL HIGH (ref 65–99)
Potassium: 4.5 mmol/L (ref 3.5–5.1)
Sodium: 139 mmol/L (ref 135–145)

## 2016-08-04 MED ORDER — SENNOSIDES-DOCUSATE SODIUM 8.6-50 MG PO TABS
1.0000 | ORAL_TABLET | Freq: Two times a day (BID) | ORAL | Status: DC
  Administered 2016-08-04 – 2016-08-06 (×4): 1 via ORAL
  Filled 2016-08-04 (×4): qty 1

## 2016-08-04 MED ORDER — TBO-FILGRASTIM 300 MCG/0.5ML ~~LOC~~ SOSY
300.0000 ug | PREFILLED_SYRINGE | Freq: Every day | SUBCUTANEOUS | Status: AC
  Administered 2016-08-04 – 2016-08-06 (×3): 300 ug via SUBCUTANEOUS
  Filled 2016-08-04 (×3): qty 0.5

## 2016-08-04 MED ORDER — VANCOMYCIN HCL IN DEXTROSE 750-5 MG/150ML-% IV SOLN
750.0000 mg | INTRAVENOUS | Status: DC
  Administered 2016-08-05 (×2): 750 mg via INTRAVENOUS
  Filled 2016-08-04 (×3): qty 150

## 2016-08-04 MED ORDER — OXYCODONE HCL 5 MG PO TABS: 10.0000 mg | ORAL_TABLET | Freq: Three times a day (TID) | ORAL | Status: DC | PRN

## 2016-08-04 MED ORDER — IBUPROFEN 800 MG PO TABS
800.0000 mg | ORAL_TABLET | Freq: Once | ORAL | Status: AC
  Administered 2016-08-04: 800 mg via ORAL
  Filled 2016-08-04: qty 1

## 2016-08-04 MED ORDER — ACETAMINOPHEN 500 MG PO TABS
1000.0000 mg | ORAL_TABLET | Freq: Four times a day (QID) | ORAL | Status: DC | PRN
  Administered 2016-08-04 – 2016-08-05 (×3): 1000 mg via ORAL
  Filled 2016-08-04 (×3): qty 2

## 2016-08-04 NOTE — Progress Notes (Signed)
Pharmacy Antibiotic Note  Cristina David is a 61 y.o. female admitted on 08/03/2016 with sepsis /fever in cancer pt s/p chemo 2 days ago.  Pharmacy has been consulted for Vancomycin and Cefepime dosing.   Plan: Will adjust vancomycin dosing to 750 mg iv q 18 hours for improved SCr. Will adjust dosing/check levels as clinically indicated.   Continue cefepime 2 g iv q 12 hours.    Height: 5' 3.5" (161.3 cm) Weight: 144 lb 7 oz (65.5 kg) IBW/kg (Calculated) : 53.55  Temp (24hrs), Avg:99.8 F (37.7 C), Min:97.7 F (36.5 C), Max:103.1 F (39.5 C)   Recent Labs Lab 08/03/16 0944 08/03/16 0945 08/04/16 0433  WBC 3.9  --  2.8*  CREATININE 1.54*  --  1.29*  LATICACIDVEN  --  0.7  --     Estimated Creatinine Clearance: 41.7 mL/min (by C-G formula based on SCr of 1.29 mg/dL (H)).    Allergies  Allergen Reactions  . Docetaxel Hives and Rash  . Prochlorperazine Other (See Comments)    MUSCLE TWITCHING    Antimicrobials this admission: Vanc 1/25 >>   Cefepime 1/25 >>    Dose adjustments this admission:    Microbiology results: 1/25 BCx: NGTD 1/25 UCx: pend  1/25 MRSA PCR: pend 1/25 Influenza pending  Thank you for allowing pharmacy to be a part of this patient's care.  [name] 08/04/2016 10:39 AM

## 2016-08-04 NOTE — Progress Notes (Signed)
[facility] [place] NAME: Cristina David    MR#:  [id number]  DATE OF BIRTH:  1956-07-01  SUBJECTIVE:   Patient had fever last night. No fever this morning.  REVIEW OF SYSTEMS:    Review of Systems  Constitutional: Positive for fever. Negative for chills and malaise/fatigue.  HENT: Negative.  Negative for ear discharge, ear pain, hearing loss, nosebleeds and sore throat.   Eyes: Negative.  Negative for blurred vision and pain.  Respiratory: Negative.  Negative for cough, hemoptysis, shortness of breath and wheezing.   Cardiovascular: Negative.  Negative for chest pain, palpitations and leg swelling.  Gastrointestinal: Negative.  Negative for abdominal pain, blood in stool, diarrhea, nausea and vomiting.  Genitourinary: Negative.  Negative for dysuria.  Musculoskeletal: Negative.  Negative for back pain.  Skin: Negative.   Neurological: Negative for dizziness, tremors, speech change, focal weakness, seizures and headaches.  Endo/Heme/Allergies: Negative.  Does not bruise/bleed easily.  Psychiatric/Behavioral: Negative.  Negative for depression, hallucinations and suicidal ideas.    Tolerating Diet: yes      DRUG ALLERGIES:   Allergies  Allergen Reactions  . Docetaxel Hives and Rash  . Prochlorperazine Other (See Comments)    MUSCLE TWITCHING    VITALS:  Blood pressure (!) 101/45, pulse 73, temperature 97.7 F (36.5 C), temperature source Oral, resp. rate 20, height 5' 3.5" (1.613 m), weight 65.5 kg (144 lb 7 oz), SpO2 98 %.  PHYSICAL EXAMINATION:   Physical Exam  Constitutional: She is oriented to person, place, and time and well-developed, well-nourished, and in no distress. No distress.  HENT:  Head: Normocephalic.  Eyes: No scleral icterus.  Neck: Normal range of motion. Neck supple. No JVD present. No tracheal deviation present.  Cardiovascular: Normal rate, regular rhythm and normal heart sounds.  Exam reveals no gallop  and no friction rub.   No murmur heard. Pulmonary/Chest: Effort normal and breath sounds normal. No respiratory distress. She has no wheezes. She has no rales. She exhibits no tenderness.  Abdominal: Soft. Bowel sounds are normal. She exhibits no distension and no mass. There is no tenderness. There is no rebound and no guarding.  Musculoskeletal: Normal range of motion. She exhibits no edema.  Neurological: She is alert and oriented to person, place, and time.  Skin: Skin is warm. No rash noted. No erythema.  Psychiatric: Affect and judgment normal.      LABORATORY PANEL:   CBC  Recent Labs Lab 08/04/16 0433  WBC 2.8*  HGB 9.7*  HCT 26.5*  PLT 188   ------------------------------------------------------------------------------------------------------------------  Chemistries   Recent Labs Lab 08/03/16 0944 08/04/16 0433  NA 133* 139  K 4.5 4.5  CL 100* 111  CO2 25 23  GLUCOSE 118* 112*  BUN 21* 18  CREATININE 1.54* 1.29*  CALCIUM 8.3* 7.5*  AST 53*  --   ALT 20  --   ALKPHOS 126  --   BILITOT 1.0  --    ------------------------------------------------------------------------------------------------------------------  Cardiac Enzymes No results for input(s): TROPONINI in the last 168 hours. ------------------------------------------------------------------------------------------------------------------  RADIOLOGY:  Dg Chest 2 View  Result Date: 08/03/2016 CLINICAL DATA:  Fevers, history of metastatic breast carcinoma EXAM: CHEST  2 VIEW COMPARISON:  09/14/2015 FINDINGS: Cardiac shadow is mildly enlarged. Postsurgical changes are noted on the right consistent prior breast surgery. Multiple areas of prior bony metastatic disease are seen particularly in the left ribcage with mild deformity. The overall appearance is stable. Sclerotic foci in the thoracic spine  are noted consistent with metastatic disease as well. No compression deformity is seen. No sizable  effusion or focal infiltrate is noted. IMPRESSION: Changes consistent with the bony metastatic disease no acute infiltrate is noted. Electronically Signed   By: Inez Catalina M.D.   On: 08/03/2016 12:42     ASSESSMENT AND PLAN:   61 year old female with metastatic breast cancer who is admitted with fevers.  1. Fever/febrile illness and immunocompromised patient with metastatic breast cancer Continue vancomycin and cefepime Follow-up on final blood cultures Continue when necessary Tylenol  2. Metastatic breast cancer: Routine care is at [facility] in [place] oncology evaluation here  3. Myelosuppression from chemotherapy: Plan per oncology Patient had recent blood transfusion on January 23   Management plans discussed with the patient and she is in agreement.  CODE STATUS: FULL  TOTAL TIME TAKING CARE OF THIS PATIENT: 33 minutes.     POSSIBLE D/C 1-2 days, DEPENDING ON CLINICAL CONDITION.   Binyamin Nelis M.D on 08/04/2016 at 9:54 AM  Between 7am to 6pm - Pager - [phone] After 6pm go to [email or web address] - password [email or web address] [facility]  Office  [phone]  CC: Primary care physician; Wilhemena Durie, MD  Note: This dictation was prepared with Dragon dictation along with smaller phrase technology. Any transcriptional errors that result from this process are unintentional.

## 2016-08-05 ENCOUNTER — Encounter: Payer: Self-pay | Admitting: Family Medicine

## 2016-08-05 LAB — CBC WITH DIFFERENTIAL/PLATELET
Basophils Absolute: 0 10*3/uL (ref 0–0.1)
Basophils Relative: 1 %
Eosinophils Absolute: 0 10*3/uL (ref 0–0.7)
Eosinophils Relative: 2 %
HCT: 28.7 % — ABNORMAL LOW (ref 35.0–47.0)
Hemoglobin: 10 g/dL — ABNORMAL LOW (ref 12.0–16.0)
Lymphocytes Relative: 8 %
Lymphs Abs: 0.2 10*3/uL — ABNORMAL LOW (ref 1.0–3.6)
MCH: 32.8 pg (ref 26.0–34.0)
MCHC: 34.9 g/dL (ref 32.0–36.0)
MCV: 94.1 fL (ref 80.0–100.0)
Monocytes Absolute: 0 10*3/uL — ABNORMAL LOW (ref 0.2–0.9)
Monocytes Relative: 1 %
Neutro Abs: 1.8 10*3/uL (ref 1.4–6.5)
Neutrophils Relative %: 88 %
Platelets: 170 10*3/uL (ref 150–440)
RBC: 3.05 MIL/uL — ABNORMAL LOW (ref 3.80–5.20)
RDW: 21.1 % — ABNORMAL HIGH (ref 11.5–14.5)
WBC: 2 10*3/uL — ABNORMAL LOW (ref 3.6–11.0)

## 2016-08-05 NOTE — Progress Notes (Signed)
[facility] at [place] NAME: Cristina David    MR#:  [id number]  DATE OF BIRTH:  12-01-1955  SUBJECTIVE:   Patient had fever this am 100.9 feels fatigue  REVIEW OF SYSTEMS:    Review of Systems  Constitutional: Positive for fever. Negative for chills and malaise/fatigue.  HENT: Negative.  Negative for ear discharge, ear pain, hearing loss, nosebleeds and sore throat.   Eyes: Negative.  Negative for blurred vision and pain.  Respiratory: Negative.  Negative for cough, hemoptysis, shortness of breath and wheezing.   Cardiovascular: Negative.  Negative for chest pain, palpitations and leg swelling.  Gastrointestinal: Negative.  Negative for abdominal pain, blood in stool, diarrhea, nausea and vomiting.  Genitourinary: Negative.  Negative for dysuria.  Musculoskeletal: Negative.  Negative for back pain.  Skin: Negative.   Neurological: Negative for dizziness, tremors, speech change, focal weakness, seizures and headaches.  Endo/Heme/Allergies: Negative.  Does not bruise/bleed easily.  Psychiatric/Behavioral: Negative.  Negative for depression, hallucinations and suicidal ideas.    Tolerating Diet: yes      DRUG ALLERGIES:   Allergies  Allergen Reactions  . Docetaxel Hives and Rash  . Prochlorperazine Other (See Comments)    MUSCLE TWITCHING    VITALS:  Blood pressure (!) 109/49, pulse 81, temperature 98.9 F (37.2 C), temperature source Oral, resp. rate 20, height 5' 3.5" (1.613 m), weight 65.5 kg (144 lb 7 oz), SpO2 98 %.  PHYSICAL EXAMINATION:   Physical Exam  Constitutional: She is oriented to person, place, and time and well-developed, well-nourished, and in no distress. No distress.  HENT:  Head: Normocephalic.  Eyes: No scleral icterus.  Neck: Normal range of motion. Neck supple. No JVD present. No tracheal deviation present.  Cardiovascular: Normal rate, regular rhythm and normal heart sounds.  Exam reveals no gallop and no  friction rub.   No murmur heard. Pulmonary/Chest: Effort normal and breath sounds normal. No respiratory distress. She has no wheezes. She has no rales. She exhibits no tenderness.  Abdominal: Soft. Bowel sounds are normal. She exhibits no distension and no mass. There is no tenderness. There is no rebound and no guarding.  Musculoskeletal: Normal range of motion. She exhibits no edema.  Neurological: She is alert and oriented to person, place, and time.  Skin: Skin is warm. No rash noted. No erythema.  Psychiatric: Affect and judgment normal.      LABORATORY PANEL:   CBC  Recent Labs Lab 08/05/16 0444  WBC 2.0*  HGB 10.0*  HCT 28.7*  PLT 170   ------------------------------------------------------------------------------------------------------------------  Chemistries   Recent Labs Lab 08/03/16 0944 08/04/16 0433  NA 133* 139  K 4.5 4.5  CL 100* 111  CO2 25 23  GLUCOSE 118* 112*  BUN 21* 18  CREATININE 1.54* 1.29*  CALCIUM 8.3* 7.5*  AST 53*  --   ALT 20  --   ALKPHOS 126  --   BILITOT 1.0  --    ------------------------------------------------------------------------------------------------------------------  Cardiac Enzymes No results for input(s): TROPONINI in the last 168 hours. ------------------------------------------------------------------------------------------------------------------  RADIOLOGY:  Dg Chest 2 View  Result Date: 08/03/2016 CLINICAL DATA:  Fevers, history of metastatic breast carcinoma EXAM: CHEST  2 VIEW COMPARISON:  09/14/2015 FINDINGS: Cardiac shadow is mildly enlarged. Postsurgical changes are noted on the right consistent prior breast surgery. Multiple areas of prior bony metastatic disease are seen particularly in the left ribcage with mild deformity. The overall appearance is stable. Sclerotic foci in the thoracic spine are  noted consistent with metastatic disease as well. No compression deformity is seen. No sizable effusion or  focal infiltrate is noted. IMPRESSION: Changes consistent with the bony metastatic disease no acute infiltrate is noted. Electronically Signed   By: Inez Catalina M.D.   On: 08/03/2016 12:42     ASSESSMENT AND PLAN:   61 year old female with metastatic breast cancer who is admitted with fevers.  1. Fever/febrile illness and immunocompromised patient with metastatic breast cancer Continue vancomycin and cefepime blood cultures are NTD  2. Metastatic breast cancer: Routine care is at [facility] in [place] oncology evaluation here  3. Myelosuppression from chemotherapy: Plan per oncology Patient had recent blood transfusion on January 23 Continue Granix through tomorrow  4. Depression on Celelxa  Management plans discussed with the patient and husband and she is in agreement.  CODE STATUS: FULL  TOTAL TIME TAKING CARE OF THIS PATIENT: 24 minutes.  Patient needs to afebrile for 24 hours before discharge Plan of care d/w dr Grayland Ormond as outlined above  POSSIBLE D/C 1-2 days, DEPENDING ON CLINICAL CONDITION.   Cristina David M.D on 08/05/2016 at 10:53 AM  Between 7am to 6pm - Pager - [phone] After 6pm go to [email or web address] - password [email or web address] [facility]  Office  [phone]  CC: Primary care physician; Wilhemena Durie, MD  Note: This dictation was prepared with Dragon dictation along with smaller phrase technology. Any transcriptional errors that result from this process are unintentional.

## 2016-08-06 DIAGNOSIS — R509 Fever, unspecified: Secondary | ICD-10-CM

## 2016-08-06 LAB — CBC WITH DIFFERENTIAL/PLATELET
Basophils Absolute: 0 10*3/uL (ref 0–0.1)
Basophils Relative: 1 %
Eosinophils Absolute: 0.1 10*3/uL (ref 0–0.7)
Eosinophils Relative: 2 %
HCT: 28.1 % — ABNORMAL LOW (ref 35.0–47.0)
Hemoglobin: 9.7 g/dL — ABNORMAL LOW (ref 12.0–16.0)
Lymphocytes Relative: 14 %
Lymphs Abs: 0.3 10*3/uL — ABNORMAL LOW (ref 1.0–3.6)
MCH: 32.8 pg (ref 26.0–34.0)
MCHC: 34.5 g/dL (ref 32.0–36.0)
MCV: 95.1 fL (ref 80.0–100.0)
Monocytes Absolute: 0.1 10*3/uL — ABNORMAL LOW (ref 0.2–0.9)
Monocytes Relative: 3 %
Neutro Abs: 2 10*3/uL (ref 1.4–6.5)
Neutrophils Relative %: 80 %
Platelets: 126 10*3/uL — ABNORMAL LOW (ref 150–440)
RBC: 2.95 MIL/uL — ABNORMAL LOW (ref 3.80–5.20)
RDW: 20.4 % — ABNORMAL HIGH (ref 11.5–14.5)
WBC: 2.4 10*3/uL — ABNORMAL LOW (ref 3.6–11.0)

## 2016-08-06 MED ORDER — LEVOFLOXACIN 750 MG PO TABS: 750.0000 mg | ORAL_TABLET | Freq: Every day | ORAL | 0 refills | Status: AC

## 2016-08-06 NOTE — Progress Notes (Signed)
[facility]  [phone] [phone]  ID: CALEA HEMPSTEAD OB: May 10, 1956  MR#: [id number]  [id number]  Patient Care Team: Jerrol Banana., MD as PCP - General (Family Medicine)  CHIEF COMPLAINT: 61 y.o. female with metastatic breast cancer who was admitted throught the emergency room with fever on day 3 s/p cycle #1 gemcitabine.  INTERVAL HISTORY: Patient with low-grade fever yesterday, otherwise feels back to her baseline.  REVIEW OF SYSTEMS:   Review of Systems  Constitutional: Positive for fever. Negative for malaise/fatigue and weight loss.  Respiratory: Negative.  Negative for cough, hemoptysis and shortness of breath.   Cardiovascular: Negative.  Negative for chest pain and leg swelling.  Gastrointestinal: Negative.  Negative for abdominal pain.  Genitourinary: Negative.   Musculoskeletal: Negative.   Neurological: Negative.  Negative for weakness.  Psychiatric/Behavioral: Negative.  The patient is not nervous/anxious.     As per HPI. Otherwise, a complete review of systems is negative.  PAST MEDICAL HISTORY: Past Medical History:  Diagnosis Date  . Cancer [facility])     PAST SURGICAL HISTORY: Past Surgical History:  Procedure Laterality Date  . ABDOMINAL HYSTERECTOMY     with oophorectomy  . BREAST LUMPECTOMY Right   . BREAST RECONSTRUCTION Right   . CHOLECYSTECTOMY    . DERMOID CYST  EXCISION    . ENDOMETRIAL ABLATION    . EXPLORATORY LAPAROTOMY      FAMILY HISTORY: Family History  Problem Relation Age of Onset  . Arthritis Mother   . Prostate cancer Father   . Asthma Father   . Breast cancer Maternal Aunt     ADVANCED DIRECTIVES (Y/N):  @ADVDIR @  HEALTH MAINTENANCE: Social History  Substance Use Topics  . Smoking status: Never Smoker  . Smokeless tobacco: Never Used  . Alcohol use Yes     Comment: 1 glass of wine amonth     Colonoscopy:  PAP:  Bone density:  Lipid panel:  Allergies  Allergen  Reactions  . Docetaxel Hives and Rash  . Prochlorperazine Other (See Comments)    MUSCLE TWITCHING    Current Facility-Administered Medications  Medication Dose Route Frequency Provider Last Rate Last Dose  . acetaminophen (TYLENOL) tablet 1,000 mg  1,000 mg Oral Q6H PRN Bettey Costa, MD   1,000 mg at 08/05/16 2009  . ceFEPIme (MAXIPIME) 2 g in dextrose 5 % 50 mL IVPB  2 g Intravenous Q12H Fritzi Mandes, MD   2 g at 08/06/16 1037  . citalopram (CELEXA) tablet 10 mg  10 mg Oral Daily Fritzi Mandes, MD   10 mg at 08/06/16 1038  . cyclobenzaprine (FLEXERIL) tablet 10 mg  10 mg Oral TID PRN Fritzi Mandes, MD      . fentaNYL ([place] - dosed mcg/hr) 50 mcg  50 mcg Transdermal Q72H Fritzi Mandes, MD   50 mcg at 08/04/16 0939  . gabapentin (NEURONTIN) capsule 100 mg  100 mg Oral q morning - 10a Fritzi Mandes, MD   100 mg at 08/06/16 1038  . gabapentin (NEURONTIN) capsule 200 mg  200 mg Oral QHS Fritzi Mandes, MD   200 mg at 08/05/16 2009  . loratadine (CLARITIN) tablet 10 mg  10 mg Oral Daily Fritzi Mandes, MD   10 mg at 08/06/16 1038  . ondansetron (ZOFRAN) tablet 4 mg  4 mg Oral Q6H PRN Fritzi Mandes, MD       Or  . ondansetron (ZOFRAN) injection 4 mg  4 mg Intravenous Q6H PRN Fritzi Mandes, MD  4 mg at 08/05/16 2049  . oxyCODONE (Oxy IR/ROXICODONE) immediate release tablet 10 mg  10 mg Oral Q8H PRN Napoleon Form, RPH      . pantoprazole (PROTONIX) EC tablet 40 mg  40 mg Oral Daily Fritzi Mandes, MD   40 mg at 08/05/16 1728  . pyridOXINE (VITAMIN B-6) tablet   Oral Daily Fritzi Mandes, MD   50 mg at 08/06/16 1038  . senna-docusate (Senokot-S) tablet 1 tablet  1 tablet Oral BID Bettey Costa, MD   1 tablet at 08/06/16 1038  . traMADol (ULTRAM) tablet 50 mg  50 mg Oral Q6H PRN Fritzi Mandes, MD      . vancomycin (VANCOCIN) IVPB 750 mg/150 ml premix  750 mg Intravenous Q18H Napoleon Form, RPH   750 mg at 08/05/16 2240  . vitamin B-12 (CYANOCOBALAMIN) tablet 100 mcg  100 mcg Oral Daily Fritzi Mandes, MD   100 mcg at 08/06/16 1038     OBJECTIVE: Vitals:   08/05/16 2144 08/06/16 0515  BP:  (!) 124/49  Pulse:  85  Resp:  19  Temp: 100 F (37.8 C) 98 F (36.7 C)     Body mass index is 25.18 kg/m.    ECOG FS:0 - Asymptomatic  General: Well-developed, well-nourished, no acute distress. Eyes: Pink conjunctiva, anicteric sclera. HEENT: Normocephalic, moist mucous membranes, clear oropharnyx. Lungs: Clear to auscultation bilaterally. Heart: Regular rate and rhythm. No rubs, murmurs, or gallops. Abdomen: Soft, nontender, nondistended. No organomegaly noted, normoactive bowel sounds. Musculoskeletal: No edema, cyanosis, or clubbing. Neuro: Alert, answering all questions appropriately. Cranial nerves grossly intact. Skin: No rashes or petechiae noted. Psych: Normal affect. Lymphatics: No cervical, calvicular, axillary or inguinal LAD.   LAB RESULTS:  Lab Results  Component Value Date   NA 139 08/04/2016   K 4.5 08/04/2016   CL 111 08/04/2016   CO2 23 08/04/2016   GLUCOSE 112 (H) 08/04/2016   BUN 18 08/04/2016   CREATININE 1.29 (H) 08/04/2016   CALCIUM 7.5 (L) 08/04/2016   PROT 7.1 08/03/2016   ALBUMIN 3.5 08/03/2016   AST 53 (H) 08/03/2016   ALT 20 08/03/2016   ALKPHOS 126 08/03/2016   BILITOT 1.0 08/03/2016   GFRNONAA 43 (L) 08/04/2016   GFRAA 50 (L) 08/04/2016    Lab Results  Component Value Date   WBC 2.4 (L) 08/06/2016   NEUTROABS 2.0 08/06/2016   HGB 9.7 (L) 08/06/2016   HCT 28.1 (L) 08/06/2016   MCV 95.1 08/06/2016   PLT 126 (L) 08/06/2016     STUDIES: Dg Mandible 4 Views  Result Date: 07/28/2016 CLINICAL DATA:  Jaw pain EXAM: MANDIBLE - 4+ VIEW COMPARISON:  None. FINDINGS: There is no evidence of fracture or other focal bone lesions. Metallic dental artifacts are noted. IMPRESSION: Negative. Electronically Signed   By: Lahoma Crocker M.D.   On: 07/28/2016 16:16   Dg Chest 2 View  Result Date: 08/03/2016 CLINICAL DATA:  Fevers, history of metastatic breast carcinoma EXAM: CHEST  2 VIEW  COMPARISON:  09/14/2015 FINDINGS: Cardiac shadow is mildly enlarged. Postsurgical changes are noted on the right consistent prior breast surgery. Multiple areas of prior bony metastatic disease are seen particularly in the left ribcage with mild deformity. The overall appearance is stable. Sclerotic foci in the thoracic spine are noted consistent with metastatic disease as well. No compression deformity is seen. No sizable effusion or focal infiltrate is noted. IMPRESSION: Changes consistent with the bony metastatic disease no acute infiltrate is noted. Electronically Signed  By: Inez Catalina M.D.   On: 08/03/2016 12:42   Dg Abd 1 View  Result Date: 07/28/2016 CLINICAL DATA:  Abdominal pain.  Metastatic breast carcinoma EXAM: ABDOMEN - 1 VIEW COMPARISON:  CT abdomen and pelvis September 14, 2015 FINDINGS: There are surgical clips in the right abdomen and pelvis. There is diffuse stool throughout colon. There is no bowel dilatation or air-fluid level suggesting bowel obstruction. No free air. There is widespread sclerotic bony metastatic disease. IMPRESSION: Diffuse stool throughout colon. No bowel obstruction or free air evident. Widespread sclerotic bony metastatic disease. Electronically Signed   By: Lowella Grip III M.D.   On: 07/28/2016 16:14   Dg Hips Bilat With Pelvis 3-4 Views  Result Date: 07/28/2016 CLINICAL DATA:  Groin pain EXAM: DG HIP (WITH OR WITHOUT PELVIS) 3-4V BILAT COMPARISON:  09/14/2015 FINDINGS: Diffuse sclerotic metastatic disease within the pelvis is not significantly changed. No acute fracture. No dislocation. Unremarkable soft tissues. IMPRESSION: Stable diffuse sclerotic metastatic disease.  No acute bony injury. Electronically Signed   By: Marybelle Killings M.D.   On: 07/28/2016 16:14    ASSESSMENT: Metastatic breast cancer who was admitted throught the emergency room with fever on day 3 s/p cycle #1 gemcitabine.  PLAN:    1.  Oncology:  Metastatic breast cancer to bone and  liver.  Day 3 s/p cycle #1 single agent gemcitabine.  Plan per outside oncologist (Dr. Laveda Norman at [facility] in [place], [place]; ph:  [phone]) was for 3 days of GCSF secondary to anticipated myelosuppression.  Last day of Granix next scheduled for today. Patient has been instructed to call her primary oncologist on Monday morning. She has follow-up on Tuesday for her next scheduled chemotherapy.  2.  Hematology:  Patient is s/p PRBC transfusion on 08/01/2016.  Monitor counts daily.  3.  Infectious disease:  All cultures are negative to date with no obvious source of infection. Okay to discharge today from oncology standpoint. Recommend giving prophylactic Levaquin 500 mg daily for 7 days. Ensure patient calls her primary oncologist tomorrow and keeps her appointment on Tuesday.    Appreciate consult, call with questions.  Lloyd Huger, MD   08/06/2016 10:57 AM

## 2016-08-06 NOTE — Progress Notes (Signed)
Discharge instructions along with home medications and follow up gone over with patient and husband. Both verbalize that they understood instructions. No prescriptions given to patient. IV removed. Pt being discharged home on room air, no distress noted. Pt refused wheelchair, insisting on ambulating to her car. Ammie Dalton, RN

## 2016-08-06 NOTE — Discharge Summary (Signed)
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 Cancer [facility])     HOSPITAL COURSE:   61 year old female with metastatic breast cancer who is admitted with fevers.  1. Fever/febrile illness and immunocompromised patient with metastatic breast cancer She was initiated on broad-spectrum antibiotics including vancomycin and cefepime. There was no etiology of fever. Urine culture was negative as well as blood cultures. Chest x-ray showed no evidence of pneumonia. Influenza testing was negative. She will be discharged on an per Levaquin.   2. Metastatic breast cancer: Routine care is at [facility] in [facility] She will follow-up with her oncologist early in the week.  3. 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 White blood cell count is improving 4. Depression on Celelxa   DISCHARGE CONDITIONS AND DIET:   Stable for discharge on regular diet  CONSULTS OBTAINED:  Treatment Team:  Lequita Asal, MD  DRUG ALLERGIES:   Allergies  Allergen Reactions  . Docetaxel Hives and Rash  . Prochlorperazine Other (See Comments)    MUSCLE TWITCHING    DISCHARGE MEDICATIONS:   Current Discharge Medication List    START taking these medications   Details  levofloxacin (LEVAQUIN) 750 MG tablet Take 1 tablet (750 mg total) by mouth daily. Qty: 5 tablet, Refills: 0      CONTINUE these  medications which have NOT CHANGED   Details  citalopram (CELEXA) 10 MG tablet Take 10 mg by mouth daily.    cyanocobalamin 100 MCG tablet Take by mouth.    cyclobenzaprine (FLEXERIL) 10 MG tablet Take 1 tablet (10 mg total) by mouth 3 (three) times daily as needed for muscle spasms. Qty: 90 tablet, Refills: 0   Associated Diagnoses: Low back pain without sciatica, unspecified back pain laterality    denosumab (XGEVA) 120 MG/1.7ML SOLN injection Inject 120 mg into the skin every 6 (six) weeks.     fentaNYL (DURAGESIC - DOSED MCG/HR) 50 MCG/HR Place 50 mcg onto the skin every 3 (three) days.    gabapentin (NEURONTIN) 100 MG capsule Take 100-300 mg by mouth 2 (two) times daily. Pt takes 1 cap qam and 2 Caps qhs    glucosamine-chondroitin 500-400 MG tablet Take 3 tablets by mouth daily.    LORazepam (ATIVAN) 0.5 MG tablet Take 1 tablet (0.5 mg total) by mouth 3 (three) times daily as needed. Qty: 90 tablet, Refills: 5    NUTRITIONAL SUPPLEMENTS PO Take by mouth.    omeprazole (PRILOSEC) 20 MG capsule TAKE ONE CAPSULE BY MOUTH TWICE A DAY Qty: 180 capsule, Refills: 3    Oxycodone HCl 10 MG TABS Take 10 mg by mouth every 8 (eight) hours as needed.    Pyridoxine HCl (VITAMIN B6 PO) Take 1 tablet by mouth daily.     SUMAtriptan (IMITREX) 100 MG tablet 1 tablet daily as needed May repeat in 2 hours if headache persists or recurs. Qty: 9 tablet, Refills: 12  docusate sodium (STOOL SOFTENER) 100 MG capsule Take 100 mg by mouth.    fexofenadine (ALLEGRA) 180 MG tablet Take 180 mg by mouth daily.      STOP taking these medications     fentaNYL (DURAGESIC - DOSED MCG/HR) 75 MCG/HR      meclizine (ANTIVERT) 25 MG tablet               Today   CHIEF COMPLAINT:  Doing well and ready to be discharged home today   VITAL SIGNS:  Blood pressure (!) 124/49, pulse 85, temperature 98 F (36.7 C), temperature source Oral, resp. rate 19, height 5' 3.5" (1.613 m), weight 65.5 kg  (144 lb 7 oz), SpO2 97 %.   REVIEW OF SYSTEMS:  Review of Systems  Constitutional: Negative.  Negative for chills, fever and malaise/fatigue.  HENT: Negative.  Negative for ear discharge, ear pain, hearing loss, nosebleeds and sore throat.   Eyes: Negative.  Negative for blurred vision and pain.  Respiratory: Negative.  Negative for cough, hemoptysis, shortness of breath and wheezing.   Cardiovascular: Negative.  Negative for chest pain, palpitations and leg swelling.  Gastrointestinal: Negative.  Negative for abdominal pain, blood in stool, diarrhea, nausea and vomiting.  Genitourinary: Negative.  Negative for dysuria.  Musculoskeletal: Negative.  Negative for back pain.  Skin: Negative.   Neurological: Negative for dizziness, tremors, speech change, focal weakness, seizures and headaches.  Endo/Heme/Allergies: Negative.  Does not bruise/bleed easily.  Psychiatric/Behavioral: Negative.  Negative for depression, hallucinations and suicidal ideas.     PHYSICAL EXAMINATION:  GENERAL:  61 y.o.-year-old patient lying in the bed with no acute distress.  NECK:  Supple, no jugular venous distention. No thyroid enlargement, no tenderness.  LUNGS: Normal breath sounds bilaterally, no wheezing, rales,rhonchi  No use of accessory muscles of respiration.  CARDIOVASCULAR: S1, S2 normal. No murmurs, rubs, or gallops.  ABDOMEN: Soft, non-tender, non-distended. Bowel sounds present. No organomegaly or mass.  EXTREMITIES: No pedal edema, cyanosis, or clubbing.  PSYCHIATRIC: The patient is alert and oriented x 3.  SKIN: No obvious rash, lesion, or ulcer.   DATA REVIEW:   CBC  Recent Labs Lab 08/06/16 0413  WBC 2.4*  HGB 9.7*  HCT 28.1*  PLT 126*    Chemistries   Recent Labs Lab 08/03/16 0944 08/04/16 0433  NA 133* 139  K 4.5 4.5  CL 100* 111  CO2 25 23  GLUCOSE 118* 112*  BUN 21* 18  CREATININE 1.54* 1.29*  CALCIUM 8.3* 7.5*  AST 53*  --   ALT 20  --   ALKPHOS 126  --    BILITOT 1.0  --     Cardiac Enzymes No results for input(s): TROPONINI in the last 168 hours.  Microbiology Results  @MICRORSLT48 @  RADIOLOGY:  No results found.    Management plans discussed with the patient and she is in agreement. Stable for discharge home  Patient should follow up with Dr Phillip Heal  CODE STATUS:     Code Status Orders        Start     Ordered   08/03/16 1522  Full code  Continuous     08/03/16 1521    Code Status History    Date Active Date Inactive Code Status Order ID Comments User Context   This patient has a current code status but no historical code status.    Advance Directive Documentation   Flowsheet Row Most Recent Value  Type of Advance Directive  Healthcare Power of  Attorney, Living will  Pre-existing out of facility DNR order (yellow form or pink MOST form)  No data  "MOST" Form in Place?  No data      TOTAL TIME TAKING CARE OF THIS PATIENT: 36 minutes.    Note: This dictation was prepared with Dragon dictation along with smaller phrase technology. Any transcriptional errors that result from this process are unintentional.  Najai Waszak M.D on 08/06/2016 at 10:06 AM  Between 7am to 6pm - Pager - [phone] After 6pm go to [email or web address] - password [email or web address]  Office  [phone]  CC: Primary care physician; Wilhemena Durie, MD

## 2016-08-08 LAB — CULTURE, BLOOD (ROUTINE X 2)
Culture: NO GROWTH
Culture: NO GROWTH

## 2016-08-18 ENCOUNTER — Encounter: Payer: Self-pay | Admitting: Family Medicine

## 2016-10-24 ENCOUNTER — Other Ambulatory Visit: Payer: Self-pay | Admitting: Family Medicine

## 2016-11-01 ENCOUNTER — Other Ambulatory Visit: Payer: Self-pay | Admitting: Family Medicine

## 2016-12-17 ENCOUNTER — Other Ambulatory Visit: Payer: Self-pay | Admitting: Family Medicine

## 2017-03-19 ENCOUNTER — Telehealth: Payer: Self-pay | Admitting: Family Medicine

## 2017-03-19 NOTE — Telephone Encounter (Signed)
Rx written.

## 2017-03-19 NOTE — Telephone Encounter (Signed)
Please review-Brynne Doane V Zara Wendt, RMA  

## 2017-03-19 NOTE — Telephone Encounter (Signed)
Faxed to [facility] [place] with Hospice has been notified.

## 2017-03-19 NOTE — Telephone Encounter (Signed)
Cristina David with Hospice is requesting a Rx for Morphine 20MG /ML.  .25 to 1ML every hour as needed for shortness of breath sent to the pharmacy ASAP.  Rx will need to say for Hospice patient.  This medication has not been startred yet but due to changes husband is in agreement.  [facility] [address]  CB#[phone]/Please call Vivien Rota when this has been [facility]

## 2017-03-20 ENCOUNTER — Emergency Department: Payer: BLUE CROSS/BLUE SHIELD

## 2017-03-20 ENCOUNTER — Emergency Department
Admission: EM | Admit: 2017-03-20 | Discharge: 2017-03-20 | Disposition: A | Discharge status: Home / Self Care | Payer: BLUE CROSS/BLUE SHIELD | Attending: Emergency Medicine | Admitting: Emergency Medicine

## 2017-03-20 ENCOUNTER — Encounter: Payer: Self-pay | Admitting: Emergency Medicine

## 2017-03-20 ENCOUNTER — Telehealth: Payer: Self-pay | Admitting: Family Medicine

## 2017-03-20 DIAGNOSIS — Y998 Other external cause status: Secondary | ICD-10-CM | POA: Diagnosis not present

## 2017-03-20 DIAGNOSIS — Y9389 Activity, other specified: Secondary | ICD-10-CM | POA: Insufficient documentation

## 2017-03-20 DIAGNOSIS — Z853 Personal history of malignant neoplasm of breast: Secondary | ICD-10-CM | POA: Diagnosis not present

## 2017-03-20 DIAGNOSIS — Y929 Unspecified place or not applicable: Secondary | ICD-10-CM | POA: Insufficient documentation

## 2017-03-20 DIAGNOSIS — S0181XA Laceration without foreign body of other part of head, initial encounter: Secondary | ICD-10-CM

## 2017-03-20 DIAGNOSIS — S01111A Laceration without foreign body of right eyelid and periocular area, initial encounter: Secondary | ICD-10-CM | POA: Insufficient documentation

## 2017-03-20 DIAGNOSIS — Z79899 Other long term (current) drug therapy: Secondary | ICD-10-CM | POA: Diagnosis not present

## 2017-03-20 DIAGNOSIS — H1131 Conjunctival hemorrhage, right eye: Secondary | ICD-10-CM | POA: Insufficient documentation

## 2017-03-20 DIAGNOSIS — S066X0A Traumatic subarachnoid hemorrhage without loss of consciousness, initial encounter: Secondary | ICD-10-CM | POA: Diagnosis not present

## 2017-03-20 DIAGNOSIS — W19XXXA Unspecified fall, initial encounter: Secondary | ICD-10-CM | POA: Diagnosis not present

## 2017-03-20 DIAGNOSIS — Z515 Encounter for palliative care: Secondary | ICD-10-CM | POA: Diagnosis not present

## 2017-03-20 MED ORDER — MORPHINE SULFATE (PF) 4 MG/ML IV SOLN
4.0000 mg | Freq: Once | INTRAVENOUS | Status: AC
  Administered 2017-03-20: 4 mg via INTRAMUSCULAR

## 2017-03-20 MED ORDER — BACITRACIN ZINC 500 UNIT/GM EX OINT
TOPICAL_OINTMENT | Freq: Two times a day (BID) | CUTANEOUS | Status: DC
  Administered 2017-03-20: 1 via TOPICAL
  Filled 2017-03-20: qty 0.9

## 2017-03-20 MED ORDER — LEVETIRACETAM 500 MG PO TABS: 500.0000 mg | ORAL_TABLET | Freq: Two times a day (BID) | ORAL | 0 refills | Status: AC

## 2017-03-20 MED ORDER — MORPHINE SULFATE (PF) 4 MG/ML IV SOLN
INTRAVENOUS | Status: AC
  Filled 2017-03-20: qty 1

## 2017-03-20 MED ORDER — MORPHINE SULFATE (PF) 4 MG/ML IV SOLN: 4.0000 mg | Freq: Once | INTRAVENOUS | Status: DC

## 2017-03-20 MED ORDER — LEVETIRACETAM 500 MG PO TABS
1000.0000 mg | ORAL_TABLET | Freq: Once | ORAL | Status: AC
  Administered 2017-03-20: 1000 mg via ORAL
  Filled 2017-03-20: qty 2

## 2017-03-20 MED ORDER — LIDOCAINE HCL (PF) 1 % IJ SOLN
INTRAMUSCULAR | Status: AC
  Filled 2017-03-20: qty 5

## 2017-03-20 MED ORDER — LIDOCAINE-EPINEPHRINE-TETRACAINE (LET) SOLUTION
NASAL | Status: AC
  Administered 2017-03-20: 3 mL
  Filled 2017-03-20: qty 3

## 2017-03-20 NOTE — Telephone Encounter (Signed)
I will be happy to sign the death certificate

## 2017-03-20 NOTE — ED Notes (Signed)
Pt went to CT

## 2017-03-20 NOTE — Telephone Encounter (Signed)
Cristina David, RMA

## 2017-03-20 NOTE — ED Notes (Signed)
Patients linen and gown changed

## 2017-03-20 NOTE — Telephone Encounter (Signed)
Hospice called saying Dr. Nyra Capes said if you won't do the ME then will you sign the death certificate.  Toni's call back is [phone]  Thanks teri

## 2017-03-20 NOTE — Telephone Encounter (Signed)
Please review-Cristina David V Itati Brocksmith, RMA  

## 2017-03-20 NOTE — ED Provider Notes (Addendum)
[facility] Emergency Department Provider Note   ____________________________________________   First MD Initiated Contact with Patient 04/04/2017 [phone]     (approximate)  I have reviewed the triage vital signs and the nursing notes.   HISTORY  Chief Complaint Fall and Laceration    HPI Cristina David is a 61 y.o. female Who comes into the hospital today after a fall. The patient has a laceration over her right eyebrow that will not stop bleeding. The patient according to her husband was getting up without assistance and fell. Initially EMS thought she might of hit her nightstand but the patient's husband reports that she hit the floor. The patient has a history of metastatic breast cancer and is on hospice. They initially contacted the hospice nurse who assessed the wound and determined that they could not deal with it at home. She also noticed that the patient had some slurring of her speech and some shoulder pain. The patient has had some nausea. Her pain as a 7 out of 10 in intensity and she is unable to open her right eye. Patient is here today for evaluation and repair of her laceration. She denies any loss of consciousness although the patient's husband reports she laid on the floor for quite some time. He states that she has been in bed all day. He also states that they expect that she may pass within the week.   Past Medical History:  Diagnosis Date  . Cancer [facility])     Patient Active Problem List   Diagnosis Date Noted  . Febrile illness 08/03/2016  . Personal history of goiter 07/31/2016  . Absolute anemia 07/31/2016  . Acid reflux 01/28/2015  . 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 Arthropathia 01/25/2015  . Back pain, chronic 01/25/2015  . Malignant neoplasm of breast (female) ([place]) 01/25/2015  . Anxiety, generalized 01/25/2015  . Gastro-esophageal reflux disease without esophagitis 01/25/2015  . Abnormal maternal glucose tolerance,  complicating pregnancy [id number]  . Personal history of antineoplastic chemotherapy 01/25/2015  . Herpes simplex 01/25/2015  . History of endocrine, metabolic or immunity disorder 01/25/2015  . Adaptive colitis 01/25/2015  . Cannot sleep 01/25/2015  . Decreased leukocytes 01/25/2015  . Headache, migraine 01/25/2015  . Clinical depression 01/25/2015  . Thyroid nodule 01/25/2015  . Psoriasis 01/25/2015  . Breast CA ([place]) 05/20/2013  . Chronic pain associated with significant psychosocial dysfunction 07/24/2012  . 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 Continuous opioid dependence ([place]) 07/24/2012  . Vertebral compression fracture ([place]) 07/24/2012  . Primary breast cancer ([place]) 02/06/2012  . Bone metastases ([place]) 12/20/2011    Past Surgical History:  Procedure Laterality Date  . ABDOMINAL HYSTERECTOMY     with oophorectomy  . BREAST LUMPECTOMY Right   . BREAST RECONSTRUCTION Right   . CHOLECYSTECTOMY    . DERMOID CYST  EXCISION    . ENDOMETRIAL ABLATION    . EXPLORATORY LAPAROTOMY      Prior to Admission medications   Medication Sig Start Date End Date Taking? Authorizing Provider  citalopram (CELEXA) 10 MG tablet Take 10 mg by mouth at bedtime.   Yes [provider]  dexamethasone (DECADRON) 4 MG tablet Take 4 mg by mouth 2 (two) times daily.   Yes [provider]  fentaNYL (DURAGESIC - DOSED MCG/HR) 50 MCG/HR Place 100 mcg onto the skin every 3 (three) days.    Yes [provider]  gabapentin (NEURONTIN) 100 MG capsule Take 100-300 mg by mouth 2 (two) times daily. Pt  takes 1 cap qam and 2 Caps qhs   Yes [provider]  loratadine (CLARITIN) 10 MG tablet Take 10 mg by mouth daily.   Yes [provider]  LORazepam (ATIVAN) 0.5 MG tablet Take 0.25-0.5 mg by mouth 2 (two) times daily. .25 in the morning and .5 at night   Yes [provider]  omeprazole (PRILOSEC) 20 MG capsule TAKE ONE CAPSULE BY MOUTH TWICE A DAY 06/14/16  Yes  Jerrol Banana., MD  cyclobenzaprine (FLEXERIL) 10 MG tablet Take 1 tablet (10 mg total) by mouth 3 (three) times daily as needed for muscle spasms. Patient not taking: Reported on 03/19/2017 08/04/15   Jerrol Banana., MD  denosumab (XGEVA) 120 MG/1.7ML SOLN injection Inject 120 mg into the skin every 6 (six) weeks.     [provider]  levETIRAcetam (KEPPRA) 500 MG tablet Take 1 tablet (500 mg total) by mouth 2 (two) times daily. 03/25/2017   Loney Hering, MD  levofloxacin (LEVAQUIN) 750 MG tablet Take 1 tablet (750 mg total) by mouth daily. Patient not taking: Reported on 03/26/2017 08/06/16   Bettey Costa, MD  SUMAtriptan (IMITREX) 100 MG tablet 1 tablet daily as needed May repeat in 2 hours if headache persists or recurs. Patient not taking: Reported on 03/13/2017 05/30/16   Jerrol Banana., MD    Allergies Docetaxel and Prochlorperazine  Family History  Problem Relation Age of Onset  . Arthritis Mother   . Prostate cancer Father   . Asthma Father   . Breast cancer Maternal Aunt     Social History Social History  Substance Use Topics  . Smoking status: Never Smoker  . Smokeless tobacco: Never Used  . Alcohol use Yes     Comment: 1 glass of wine amonth    Review of Systems  Constitutional: No fever/chills Eyes: No visual changes. ENT: No sore throat. Cardiovascular: Denies chest pain. Respiratory: Denies shortness of breath. Gastrointestinal: No abdominal pain.  No nausea, no vomiting.  No diarrhea.  No constipation. Genitourinary: Negative for dysuria. Musculoskeletal: shoulder pain Skin: right eyebrow laceration Neurological: Negative for headaches, focal weakness or numbness.   ____________________________________________   PHYSICAL EXAM:  VITAL SIGNS: ED Triage Vitals  Enc Vitals Group     BP 03/23/2017 0523 (!) 130/44     Pulse Rate 04/06/2017 0523 100     Resp 03/11/2017 0523 17     Temp 04/04/2017 0523 (!) 97.5 F (36.4 C)      Temp Source 03/15/2017 0523 Oral     SpO2 03/18/2017 0523 98 %     Weight 03/16/2017 0525 128 lb (58.1 kg)     Height 03/12/2017 0525 5\' 3"  (1.6 m)     Head Circumference --      Peak Flow --      Pain Score 03/21/2017 0523 7     Pain Loc --      Pain Edu? --      Excl. in [place]? --     Constitutional: Alert with some mild confusion. Ill appearing and in moderate distress. Eyes: Conjunctivae are normal. PERRL. EOMI. Traumatic subconjunctival hematoma and edema to right sclera,  Head: laceration over right eyebrow, right sided eye and facial swelling Nose: No congestion/rhinnorhea. Mouth/Throat: Mucous membranes are moist.  Oropharynx non-erythematous. Neck: No cervical spine tenderness to palpation. Cardiovascular: Normal rate, regular rhythm. Grossly normal heart sounds.  Good peripheral circulation. Respiratory: Normal respiratory effort.  No retractions. Lungs CTAB. Gastrointestinal: Soft and  nontender. No distention. Positive bowel sounds Musculoskeletal: No significant tenderness to palpation of right shoulder, no bruising Neurologic:  Normal speech and language.Mildly confused and difficulty answering questions Skin:  Skin is warm, dry 4cm laceration to right eyebrow, jaundice, bruising to left forearm and right side/flank Psychiatric: Patient somnolent  ____________________________________________   LABS (all labs ordered are listed, but only abnormal results are displayed)  Labs Reviewed - No data to display ____________________________________________  EKG  none ____________________________________________  RADIOLOGY  Dg Shoulder Right  Result Date: 03/27/2017 CLINICAL DATA:  Right shoulder pain after a fall. EXAM: RIGHT SHOULDER - 2+ VIEW COMPARISON:  None. FINDINGS: Right shoulder appears intact. No evidence of acute fracture or subluxation. No focal bone lesion or bone destruction. Bone cortex and trabecular architecture appear intact. No radiopaque soft tissue foreign  bodies. Surgical clips in the right axilla. IMPRESSION: No acute bony abnormalities. Electronically Signed   By: Lucienne Capers M.D.   On: 04/04/2017 06:12   Ct Head Wo Contrast  Result Date: 03/11/2017 CLINICAL DATA:  Stage IV breast cancer, has not moved or talked in 2 days. Fell from bed. RIGHT eye injury. EXAM: CT HEAD WITHOUT CONTRAST CT MAXILLOFACIAL WITHOUT CONTRAST CT CERVICAL SPINE WITHOUT CONTRAST TECHNIQUE: Multidetector CT imaging of the head, cervical spine, and maxillofacial structures were performed using the standard protocol without intravenous contrast. Multiplanar CT image reconstructions of the cervical spine and maxillofacial structures were also generated. COMPARISON:  None. FINDINGS: CT HEAD FINDINGS BRAIN: Small volume LEFT inferior frontal subarachnoid hemorrhage. Superimposed anterior cranial fossa streak artifact. No intraparenchymal hemorrhage, mass effect nor midline shift. The ventricles and sulci are normal for age. Patchy supratentorial white matter hypodensities within normal range for patient's age, though non-specific are most compatible with chronic small vessel ischemic disease. No acute large vascular territory infarcts. Basal cisterns are patent. VASCULAR: Mild calcific atherosclerosis of the carotid siphons. SKULL: No skull fracture. Heterogeneous bone density compatible with osseous metastasis. No significant scalp soft tissue swelling. OTHER: None. CT MAXILLOFACIAL FINDINGS OSSEOUS: The mandible is intact, the condyles are located. No acute facial fracture. No destructive bony lesions. ORBITS: Ocular globes and orbital contents are normal. SINUSES: Paranasal sinuses are well aerated. Nasal septum is deviated the RIGHT. Included mastoid aircells are well aerated. SOFT TISSUES: RIGHT periorbital 2 premalar soft tissue swelling and subcutaneous gas without radiopaque foreign bodies. CT CERVICAL SPINE FINDINGS ALIGNMENT: Straightened lordosis. Vertebral bodies in  alignment. SKULL BASE AND VERTEBRAE: Diffusely mottled vertebral bodies and posterior elements consistent with widespread osseous metastasis. Cervical vertebral bodies and posterior elements are intact. Intervertebral disc heights preserved, multi and level mild endplate spurring. No destructive bony lesions. C1-2 articulation maintained. Longus coli insertional enthesopathy. SOFT TISSUES AND SPINAL CANAL: Nonacute. Mild calcific atherosclerosis carotid bifurcations. DISC LEVELS: No significant osseous canal stenosis. Multilevel mild neural foraminal narrowing. UPPER CHEST: Lung apices are clear. OTHER: None. IMPRESSION: CT HEAD: 1. Minimal LEFT frontal subarachnoid hemorrhage. 2. Diffuse osseous metastasis. 3. Otherwise negative noncontrast CT HEAD for age. CT MAXILLOFACIAL: 1. No acute facial fracture. 2. RIGHT periorbital/pre malar contusion and laceration. No postseptal involvement. CT CERVICAL SPINE: 1. No acute fracture or malalignment. 2. Diffuse osseous metastasis. Critical Value/emergent results were called by telephone at the time of interpretation on 03/17/2017 at 6:39 am to Dr. Charlesetta Ivory , who verbally acknowledged these results. Electronically Signed   By: Elon Alas M.D.   On: 04/02/2017 06:39   Ct Cervical Spine Wo Contrast  Result Date: 03/15/2017 CLINICAL DATA:  Stage IV  breast cancer, has not moved or talked in 2 days. Fell from bed. RIGHT eye injury. EXAM: CT HEAD WITHOUT CONTRAST CT MAXILLOFACIAL WITHOUT CONTRAST CT CERVICAL SPINE WITHOUT CONTRAST TECHNIQUE: Multidetector CT imaging of the head, cervical spine, and maxillofacial structures were performed using the standard protocol without intravenous contrast. Multiplanar CT image reconstructions of the cervical spine and maxillofacial structures were also generated. COMPARISON:  None. FINDINGS: CT HEAD FINDINGS BRAIN: Small volume LEFT inferior frontal subarachnoid hemorrhage. Superimposed anterior cranial fossa streak artifact.  No intraparenchymal hemorrhage, mass effect nor midline shift. The ventricles and sulci are normal for age. Patchy supratentorial white matter hypodensities within normal range for patient's age, though non-specific are most compatible with chronic small vessel ischemic disease. No acute large vascular territory infarcts. Basal cisterns are patent. VASCULAR: Mild calcific atherosclerosis of the carotid siphons. SKULL: No skull fracture. Heterogeneous bone density compatible with osseous metastasis. No significant scalp soft tissue swelling. OTHER: None. CT MAXILLOFACIAL FINDINGS OSSEOUS: The mandible is intact, the condyles are located. No acute facial fracture. No destructive bony lesions. ORBITS: Ocular globes and orbital contents are normal. SINUSES: Paranasal sinuses are well aerated. Nasal septum is deviated the RIGHT. Included mastoid aircells are well aerated. SOFT TISSUES: RIGHT periorbital 2 premalar soft tissue swelling and subcutaneous gas without radiopaque foreign bodies. CT CERVICAL SPINE FINDINGS ALIGNMENT: Straightened lordosis. Vertebral bodies in alignment. SKULL BASE AND VERTEBRAE: Diffusely mottled vertebral bodies and posterior elements consistent with widespread osseous metastasis. Cervical vertebral bodies and posterior elements are intact. Intervertebral disc heights preserved, multi and level mild endplate spurring. No destructive bony lesions. C1-2 articulation maintained. Longus coli insertional enthesopathy. SOFT TISSUES AND SPINAL CANAL: Nonacute. Mild calcific atherosclerosis carotid bifurcations. DISC LEVELS: No significant osseous canal stenosis. Multilevel mild neural foraminal narrowing. UPPER CHEST: Lung apices are clear. OTHER: None. IMPRESSION: CT HEAD: 1. Minimal LEFT frontal subarachnoid hemorrhage. 2. Diffuse osseous metastasis. 3. Otherwise negative noncontrast CT HEAD for age. CT MAXILLOFACIAL: 1. No acute facial fracture. 2. RIGHT periorbital/pre malar contusion and  laceration. No postseptal involvement. CT CERVICAL SPINE: 1. No acute fracture or malalignment. 2. Diffuse osseous metastasis. Critical Value/emergent results were called by telephone at the time of interpretation on 04/08/2017 at 6:39 am to Dr. Charlesetta Ivory , who verbally acknowledged these results. Electronically Signed   By: Elon Alas M.D.   On: 04/01/2017 06:39   Ct Maxillofacial Wo Contrast  Result Date: 04/01/2017 CLINICAL DATA:  Stage IV breast cancer, has not moved or talked in 2 days. Fell from bed. RIGHT eye injury. EXAM: CT HEAD WITHOUT CONTRAST CT MAXILLOFACIAL WITHOUT CONTRAST CT CERVICAL SPINE WITHOUT CONTRAST TECHNIQUE: Multidetector CT imaging of the head, cervical spine, and maxillofacial structures were performed using the standard protocol without intravenous contrast. Multiplanar CT image reconstructions of the cervical spine and maxillofacial structures were also generated. COMPARISON:  None. FINDINGS: CT HEAD FINDINGS BRAIN: Small volume LEFT inferior frontal subarachnoid hemorrhage. Superimposed anterior cranial fossa streak artifact. No intraparenchymal hemorrhage, mass effect nor midline shift. The ventricles and sulci are normal for age. Patchy supratentorial white matter hypodensities within normal range for patient's age, though non-specific are most compatible with chronic small vessel ischemic disease. No acute large vascular territory infarcts. Basal cisterns are patent. VASCULAR: Mild calcific atherosclerosis of the carotid siphons. SKULL: No skull fracture. Heterogeneous bone density compatible with osseous metastasis. No significant scalp soft tissue swelling. OTHER: None. CT MAXILLOFACIAL FINDINGS OSSEOUS: The mandible is intact, the condyles are located. No acute facial fracture. No destructive bony lesions.  ORBITS: Ocular globes and orbital contents are normal. SINUSES: Paranasal sinuses are well aerated. Nasal septum is deviated the RIGHT. Included mastoid aircells  are well aerated. SOFT TISSUES: RIGHT periorbital 2 premalar soft tissue swelling and subcutaneous gas without radiopaque foreign bodies. CT CERVICAL SPINE FINDINGS ALIGNMENT: Straightened lordosis. Vertebral bodies in alignment. SKULL BASE AND VERTEBRAE: Diffusely mottled vertebral bodies and posterior elements consistent with widespread osseous metastasis. Cervical vertebral bodies and posterior elements are intact. Intervertebral disc heights preserved, multi and level mild endplate spurring. No destructive bony lesions. C1-2 articulation maintained. Longus coli insertional enthesopathy. SOFT TISSUES AND SPINAL CANAL: Nonacute. Mild calcific atherosclerosis carotid bifurcations. DISC LEVELS: No significant osseous canal stenosis. Multilevel mild neural foraminal narrowing. UPPER CHEST: Lung apices are clear. OTHER: None. IMPRESSION: CT HEAD: 1. Minimal LEFT frontal subarachnoid hemorrhage. 2. Diffuse osseous metastasis. 3. Otherwise negative noncontrast CT HEAD for age. CT MAXILLOFACIAL: 1. No acute facial fracture. 2. RIGHT periorbital/pre malar contusion and laceration. No postseptal involvement. CT CERVICAL SPINE: 1. No acute fracture or malalignment. 2. Diffuse osseous metastasis. Critical Value/emergent results were called by telephone at the time of interpretation on 04/02/2017 at 6:39 am to Dr. Charlesetta Ivory , who verbally acknowledged these results. Electronically Signed   By: Elon Alas M.D.   On: 03/28/2017 06:39    ____________________________________________   PROCEDURES  Procedure(s) performed: please, see procedure note(s).  Marland Kitchen.Laceration Repair Date/Time: 04/03/2017 6:35 AM Performed by: Loney Hering Authorized by: Loney Hering   Consent:    Consent obtained:  Verbal   Consent given by:  Patient Anesthesia (see MAR for exact dosages):    Anesthesia method:  Topical application and local infiltration   Topical anesthetic:  LET   Local anesthetic:  Lidocaine 1%  w/o epi Laceration details:    Location:  Face   Face location:  R eyebrow   Length (cm):  4 Repair type:    Repair type:  Simple Treatment:    Area cleansed with:  Betadine, Hibiclens and saline   Amount of cleaning:  Standard Skin repair:    Repair method:  Sutures   Suture size:  5-0   Suture material:  Nylon   Suture technique:  Simple interrupted   Number of sutures:  4 Approximation:    Approximation:  Close   [place] border: well-aligned   Post-procedure details:    Dressing:  Antibiotic ointment   Patient tolerance of procedure:  Tolerated well, no immediate complications    Critical Care performed: No  ____________________________________________   INITIAL IMPRESSION / ASSESSMENT AND PLAN / ED COURSE  Pertinent labs & imaging results that were available during my care of the patient were reviewed by me and considered in my medical decision making (see chart for details).  this is a 61 year old female who comes into the hospital today after rolling out of bed. The patient fell onto the floor. She is a hospice patient and has metastatic end-stage breast cancer. Since the patient's fall was mechanical I did not check any blood work. The patient has had blood work and her liver enzymes are significantly abnormal as are many of her other labs. I did send the patient for a CT scan of her head as well as an x-ray of her shoulder. The patient has some bruising to her arms and her abdomen. The patient did receive a shot of morphine IM for her pain. I did suture the patient's wound but I did receive a call regarding the results of the patient's  CT scan. The patient has many bony metastases she does also have a traumatic subarachnoid hemorrhage. I discussed the results with the patient's family. They asked if she had to stay in the hospital and I did tell them that if it were okay with hospice and the family desired the patient could be sent home. The patient's family reports that  they would much rather her go home. I discussed the case with hospice. They did recommend me giving the patient some seizure prophylaxis. I did order some Keppra 1000 mg orally for the patient. I also contacted the patient's primary care physician Dr. Rosanna Randy who agrees with the plan. As the patient's wound is sutured I will discharge the patient to home under the care of hospice.      ____________________________________________   FINAL CLINICAL IMPRESSION(S) / ED DIAGNOSES  Final diagnoses:  Fall, initial encounter  Subarachnoid hemorrhage following injury, no loss of consciousness, initial encounter ([place])  Facial laceration, initial encounter  Traumatic subconjunctival hemorrhage of right eye      NEW MEDICATIONS STARTED DURING THIS VISIT:  New Prescriptions   LEVETIRACETAM (KEPPRA) 500 MG TABLET    Take 1 tablet (500 mg total) by mouth 2 (two) times daily.     Note:  This document was prepared using Dragon voice recognition software and may include unintentional dictation errors.    Loney Hering, MD 04/02/2017 [id number]    Loney Hering, MD 03/25/2017 [phone]

## 2017-03-20 NOTE — ED Triage Notes (Signed)
Pt arrived to the ED via EMS from home for a head injury, right eye injury, laceration to the right eyebrow secondary to a fall. Pt's husband states that the Pt is a stage 4 cancer patient and that for the last 2 days the Pt has not moved or talked; today falling off of the bed causing all the injuries. Pt is AOx3 and appears lethargic and soft spoken. Pt's husband is at bedside.

## 2017-03-29 ENCOUNTER — Telehealth: Payer: Self-pay | Admitting: Family Medicine

## 2017-03-29 NOTE — Telephone Encounter (Signed)
They need the verbal order that was faxed to [place] on sept 7 and the 10th.  They need it signed and faxed back to [phone].   Thanks [employer]

## 2017-03-30 NOTE — Telephone Encounter (Signed)
Please review, I saw some faxes earlier last week or this week to sign. Thank Edrick Kins, RMA

## 2017-04-09 DEATH — deceased

## 2017-12-06 IMAGING — CT CT HEAD W/O CM
3 of 10 series · 13 of 47 positions shown, 16 images · non-contrast
Comparison: None.

CLINICAL DATA: Stage IV breast cancer, has not moved or talked in 2
days. Fell from bed. RIGHT eye injury.

EXAM:
CT HEAD WITHOUT CONTRAST
CT MAXILLOFACIAL WITHOUT CONTRAST
CT CERVICAL SPINE WITHOUT CONTRAST
TECHNIQUE: Multidetector CT imaging of the head, cervical spine, and
maxillofacial structures were performed using the standard protocol
without intravenous contrast. Multiplanar CT image reconstructions
of the cervical spine and maxillofacial structures were also
generated.

[Series 15: orthogonal axials · axial · 0.20mm/px · z∈[-282,-124]mm · 9 of 110 slices shown, 12 images]
[im 11/110  brain]
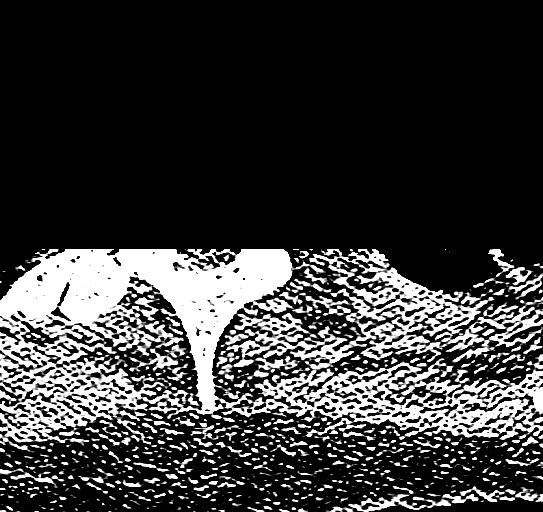
[im 11/110  bone]
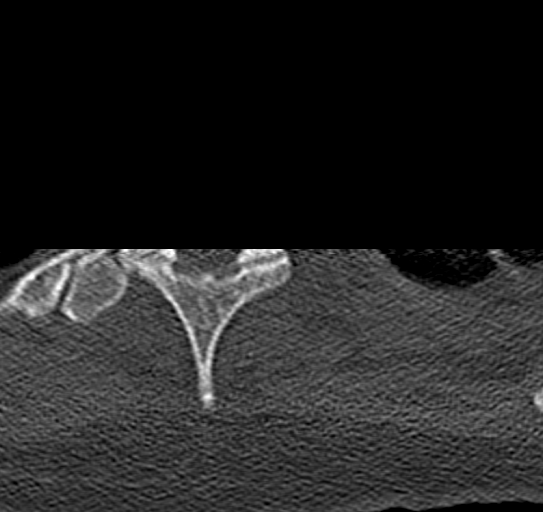
[im 22/110  brain]
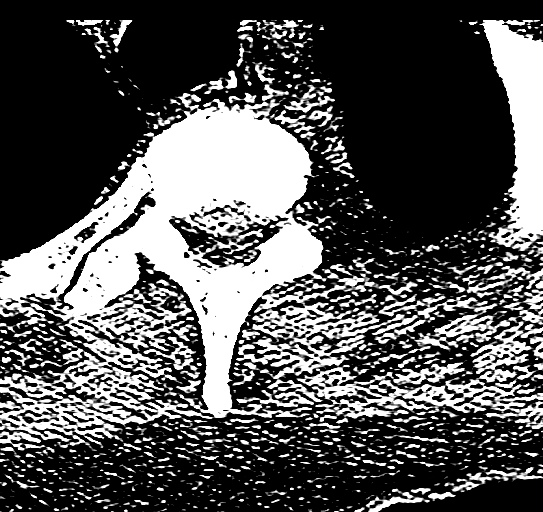
[im 33/110  brain]
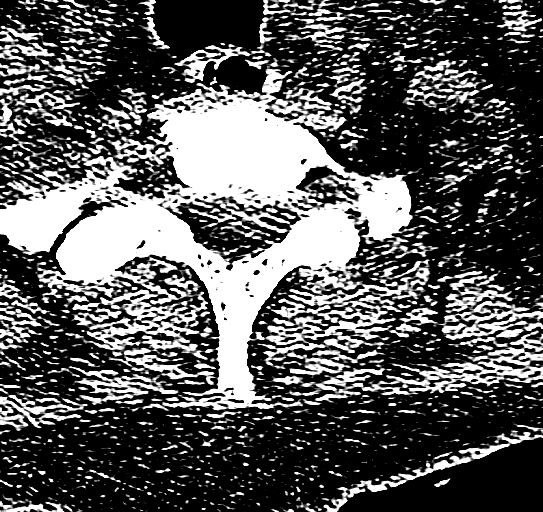
[im 44/110  brain]
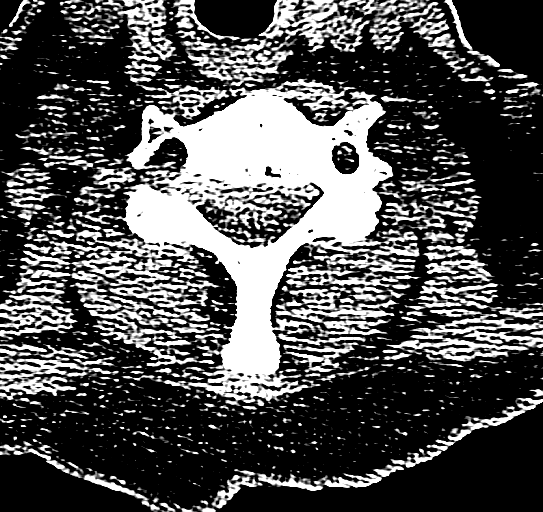
[im 55/110  brain]
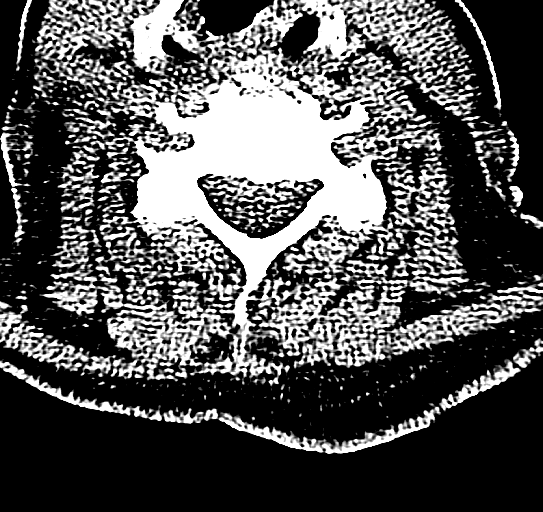
[im 55/110  bone]
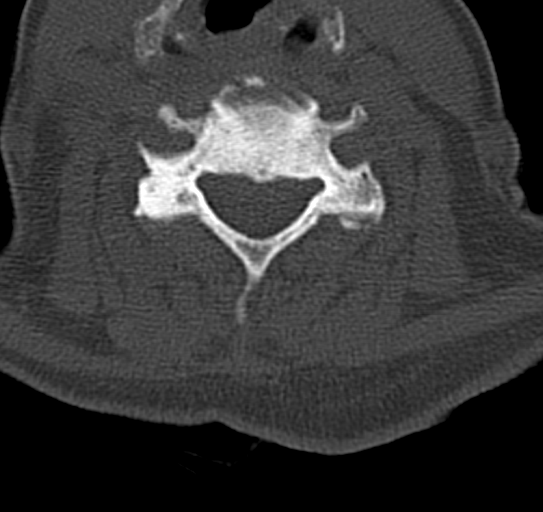
[im 66/110  brain]
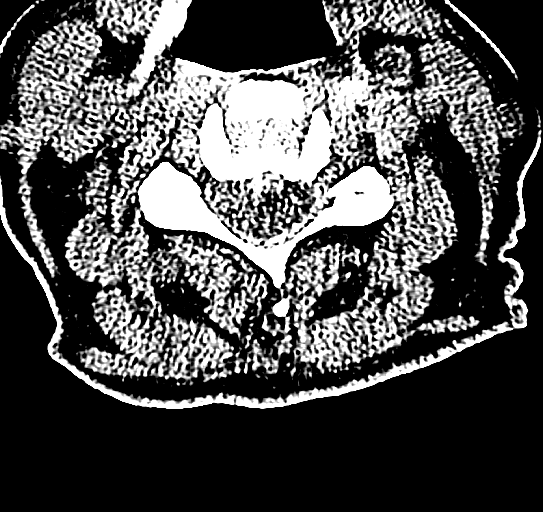
[im 77/110  brain]
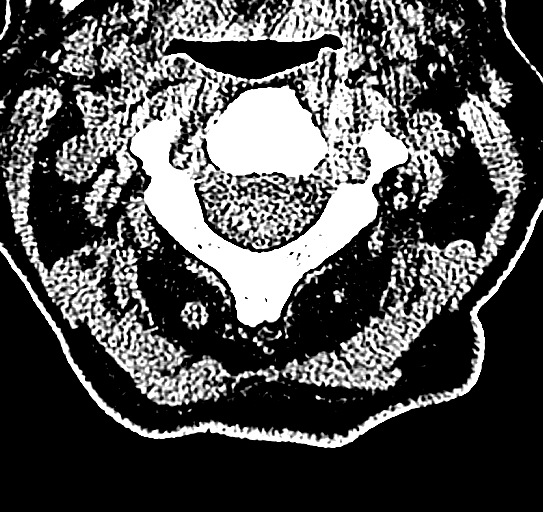
[im 88/110  brain]
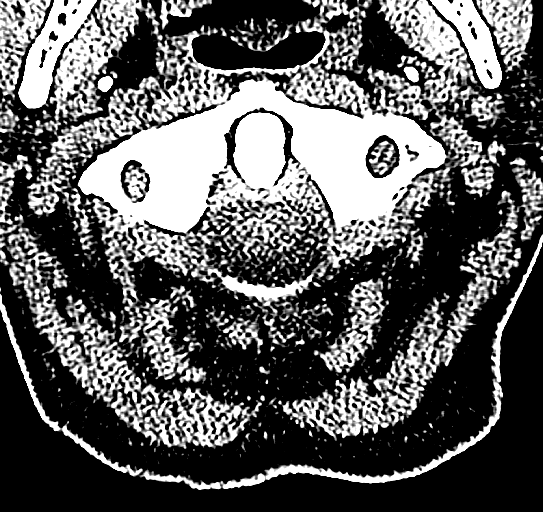
[im 99/110  brain]
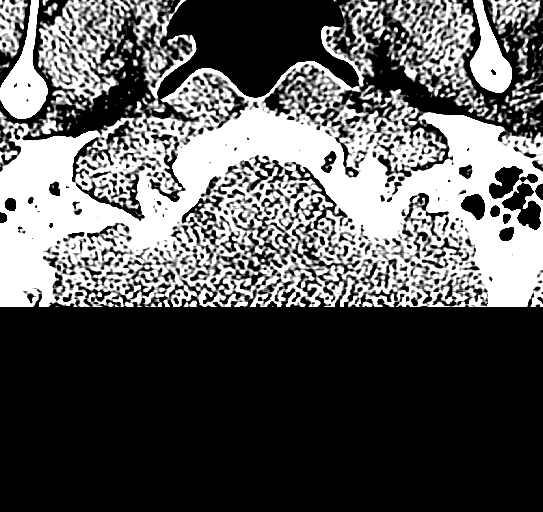
[im 99/110  bone]
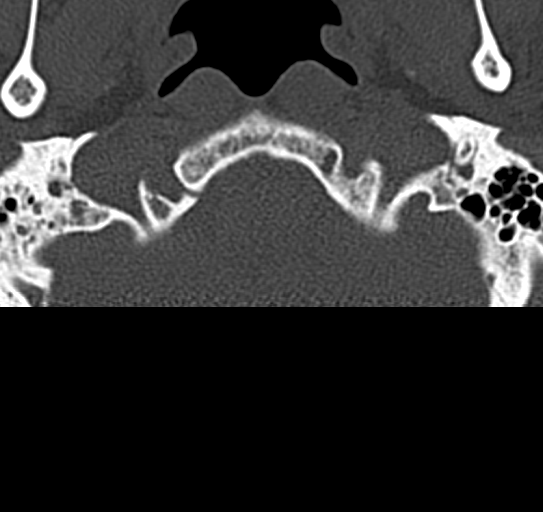

[Series 16: coronal soft · coronal · 0.30mm/px · 3 of 69 slices shown]
[im 18/69  brain]
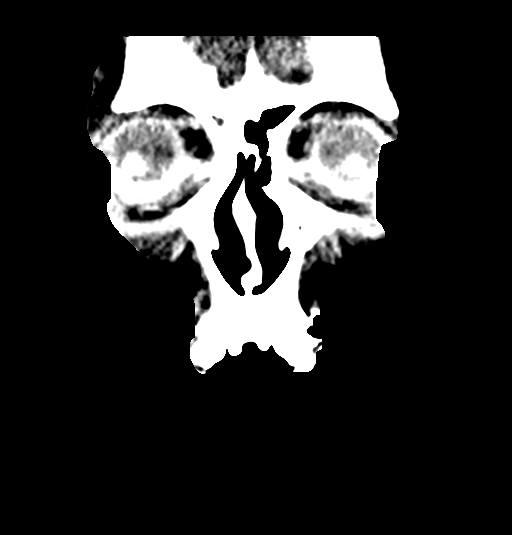
[im 35/69  brain]
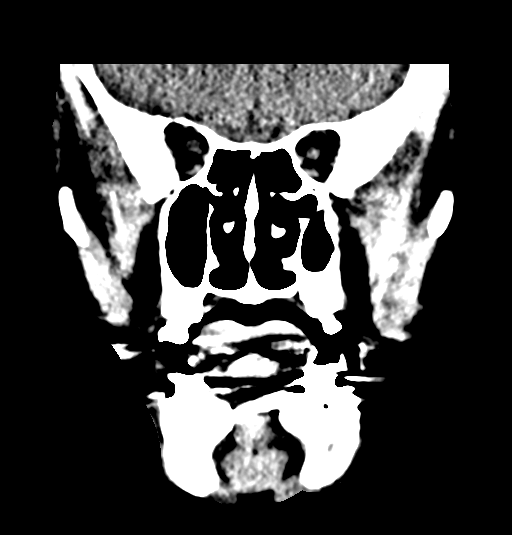
[im 52/69  brain]
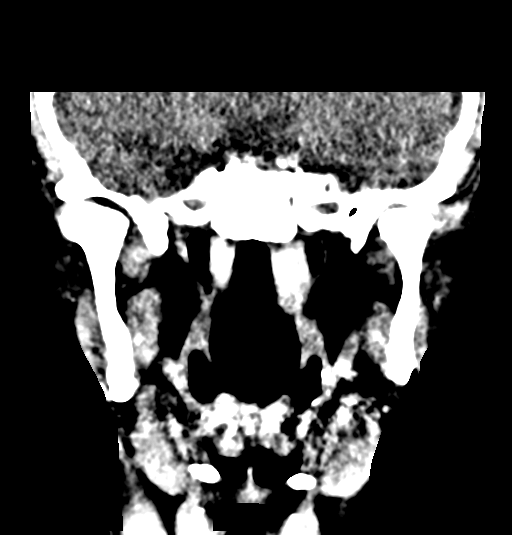

[Series 17: sagittal soft · sagittal · 0.28mm/px · 1 of 76 slices shown]
[im 38/76  brain]
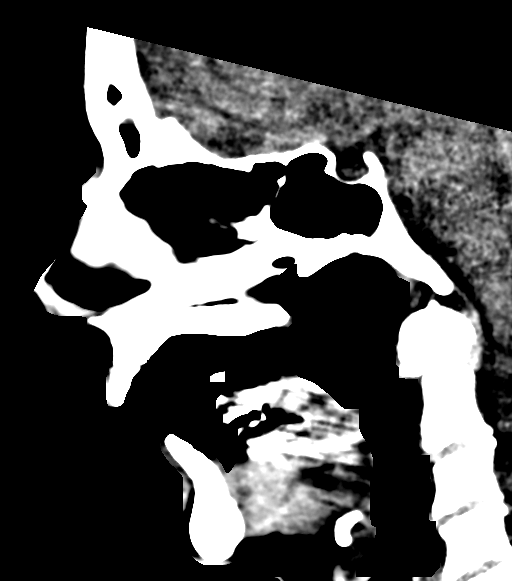

[13 of 47 positions shown; findings below may reference images not displayed]

FINDINGS: CT HEAD FINDINGS

BRAIN: Small volume LEFT inferior frontal subarachnoid hemorrhage.
Superimposed anterior cranial fossa streak artifact. No
intraparenchymal hemorrhage, mass effect nor midline shift. The
ventricles and sulci are normal for age. Patchy supratentorial white
matter hypodensities within normal range for patient's age, though
non-specific are most compatible with chronic small vessel ischemic
disease. No acute large vascular territory infarcts. Basal cisterns
are patent.

VASCULAR: Mild calcific atherosclerosis of the carotid siphons.

SKULL: No skull fracture. Heterogeneous bone density compatible with
osseous metastasis. No significant scalp soft tissue swelling.

OTHER: None.

CT MAXILLOFACIAL FINDINGS

OSSEOUS: The mandible is intact, the condyles are located. No acute
facial fracture. No destructive bony lesions.

ORBITS: Ocular globes and orbital contents are normal.

SINUSES: Paranasal sinuses are well aerated. Nasal septum is
deviated the RIGHT. Included mastoid aircells are well aerated.

SOFT TISSUES: RIGHT periorbital 2 premalar soft tissue swelling and
subcutaneous gas without radiopaque foreign bodies.

CT CERVICAL SPINE FINDINGS

ALIGNMENT: Straightened lordosis. Vertebral bodies in alignment.

SKULL BASE AND VERTEBRAE: Diffusely mottled vertebral bodies and
posterior elements consistent with widespread osseous metastasis.
Cervical vertebral bodies and posterior elements are intact.
Intervertebral disc heights preserved, multi and level mild endplate
spurring. No destructive bony lesions. C1-2 articulation maintained.
Longus coli insertional enthesopathy.

SOFT TISSUES AND SPINAL CANAL: Nonacute. Mild calcific
atherosclerosis carotid bifurcations.

DISC LEVELS: No significant osseous canal stenosis. Multilevel mild
neural foraminal narrowing.

UPPER CHEST: Lung apices are clear.

OTHER: None.
IMPRESSION: CT HEAD:

1. Minimal LEFT frontal subarachnoid hemorrhage.
2. Diffuse osseous metastasis.
3. Otherwise negative noncontrast CT HEAD for age.
CT MAXILLOFACIAL:

1. No acute facial fracture.
2. RIGHT periorbital/pre malar contusion and laceration. No
postseptal involvement.
CT CERVICAL SPINE:

1. No acute fracture or malalignment.
2. Diffuse osseous metastasis.
Critical Value/emergent results were called by telephone at the time
of interpretation on 03/20/2017 at [DATE] to Dr. GUNILD TORNBJERG ,
who verbally acknowledged these results.
